# Patient Record
Sex: Male | Born: 1960 | Race: White | Hispanic: No | Marital: Single | Smoking: Current every day smoker
Health system: Southern US, Community
[De-identification: ages and names within clinical notes are randomized; demographics above are authoritative.]

## PROBLEM LIST (undated history)

## (undated) DIAGNOSIS — J449 Chronic obstructive pulmonary disease, unspecified: Secondary | ICD-10-CM

## (undated) DIAGNOSIS — R569 Unspecified convulsions: Secondary | ICD-10-CM

## (undated) DIAGNOSIS — B1920 Unspecified viral hepatitis C without hepatic coma: Secondary | ICD-10-CM

## (undated) DIAGNOSIS — I639 Cerebral infarction, unspecified: Secondary | ICD-10-CM

---

## 2011-08-06 DIAGNOSIS — I639 Cerebral infarction, unspecified: Secondary | ICD-10-CM

## 2011-08-06 HISTORY — DX: Cerebral infarction, unspecified: I63.9

## 2018-12-12 ENCOUNTER — Encounter (HOSPITAL_COMMUNITY): Payer: Self-pay

## 2018-12-12 ENCOUNTER — Other Ambulatory Visit: Payer: Self-pay

## 2018-12-12 ENCOUNTER — Emergency Department (HOSPITAL_COMMUNITY): Payer: HRSA Program

## 2018-12-12 ENCOUNTER — Emergency Department (HOSPITAL_COMMUNITY)
Admission: EM | Admit: 2018-12-12 | Discharge: 2018-12-12 | Disposition: A | Discharge status: Home / Self Care | Payer: HRSA Program | Attending: Emergency Medicine | Admitting: Emergency Medicine

## 2018-12-12 DIAGNOSIS — F17210 Nicotine dependence, cigarettes, uncomplicated: Secondary | ICD-10-CM | POA: Diagnosis not present

## 2018-12-12 DIAGNOSIS — J449 Chronic obstructive pulmonary disease, unspecified: Secondary | ICD-10-CM | POA: Insufficient documentation

## 2018-12-12 DIAGNOSIS — R0789 Other chest pain: Secondary | ICD-10-CM | POA: Insufficient documentation

## 2018-12-12 DIAGNOSIS — F1510 Other stimulant abuse, uncomplicated: Secondary | ICD-10-CM | POA: Diagnosis not present

## 2018-12-12 DIAGNOSIS — Z20828 Contact with and (suspected) exposure to other viral communicable diseases: Secondary | ICD-10-CM | POA: Insufficient documentation

## 2018-12-12 DIAGNOSIS — R42 Dizziness and giddiness: Secondary | ICD-10-CM | POA: Diagnosis not present

## 2018-12-12 DIAGNOSIS — R079 Chest pain, unspecified: Secondary | ICD-10-CM

## 2018-12-12 DIAGNOSIS — R61 Generalized hyperhidrosis: Secondary | ICD-10-CM | POA: Insufficient documentation

## 2018-12-12 DIAGNOSIS — B182 Chronic viral hepatitis C: Secondary | ICD-10-CM | POA: Diagnosis not present

## 2018-12-12 DIAGNOSIS — R0602 Shortness of breath: Secondary | ICD-10-CM | POA: Diagnosis present

## 2018-12-12 HISTORY — DX: Cerebral infarction, unspecified: I63.9

## 2018-12-12 HISTORY — DX: Chronic obstructive pulmonary disease, unspecified: J44.9

## 2018-12-12 HISTORY — DX: Unspecified viral hepatitis C without hepatic coma: B19.20

## 2018-12-12 HISTORY — DX: Unspecified convulsions: R56.9

## 2018-12-12 LAB — CBC WITH DIFFERENTIAL/PLATELET
Abs Immature Granulocytes: 0.01 10*3/uL (ref 0.00–0.07)
Basophils Absolute: 0 10*3/uL (ref 0.0–0.1)
Basophils Relative: 1 %
Eosinophils Absolute: 0 10*3/uL (ref 0.0–0.5)
Eosinophils Relative: 1 %
HCT: 44.7 % (ref 39.0–52.0)
Hemoglobin: 15.1 g/dL (ref 13.0–17.0)
Immature Granulocytes: 0 %
Lymphocytes Relative: 11 %
Lymphs Abs: 0.9 10*3/uL (ref 0.7–4.0)
MCH: 32.7 pg (ref 26.0–34.0)
MCHC: 33.8 g/dL (ref 30.0–36.0)
MCV: 96.8 fL (ref 80.0–100.0)
Monocytes Absolute: 0.6 10*3/uL (ref 0.1–1.0)
Monocytes Relative: 7 %
Neutro Abs: 6.6 10*3/uL (ref 1.7–7.7)
Neutrophils Relative %: 80 %
Platelets: 193 10*3/uL (ref 150–400)
RBC: 4.62 MIL/uL (ref 4.22–5.81)
RDW: 12.8 % (ref 11.5–15.5)
WBC: 8.2 10*3/uL (ref 4.0–10.5)
nRBC: 0 % (ref 0.0–0.2)

## 2018-12-12 LAB — D-DIMER, QUANTITATIVE: D-Dimer, Quant: 0.62 ug/mL-FEU — ABNORMAL HIGH (ref 0.00–0.50)

## 2018-12-12 LAB — RAPID URINE DRUG SCREEN, HOSP PERFORMED
Amphetamines: POSITIVE — AB
Barbiturates: NOT DETECTED
Benzodiazepines: NOT DETECTED
Cocaine: NOT DETECTED
Opiates: NOT DETECTED
Tetrahydrocannabinol: NOT DETECTED

## 2018-12-12 LAB — BASIC METABOLIC PANEL
Anion gap: 14 (ref 5–15)
BUN: 14 mg/dL (ref 6–20)
CO2: 21 mmol/L — ABNORMAL LOW (ref 22–32)
Calcium: 10.3 mg/dL (ref 8.9–10.3)
Chloride: 102 mmol/L (ref 98–111)
Creatinine, Ser: 0.94 mg/dL (ref 0.61–1.24)
GFR calc Af Amer: >60 mL/min (ref >60)
GFR calc non Af Amer: >60 mL/min (ref >60)
Glucose, Bld: 94 mg/dL (ref 70–99)
Potassium: 4.1 mmol/L (ref 3.5–5.1)
Sodium: 137 mmol/L (ref 135–145)

## 2018-12-12 LAB — CBG MONITORING, ED: Glucose-Capillary: 94 mg/dL (ref 70–99)

## 2018-12-12 LAB — TROPONIN I
Troponin I: <0.03 ng/mL (ref <0.03)
Troponin I: <0.03 ng/mL (ref <0.03)

## 2018-12-12 LAB — BRAIN NATRIURETIC PEPTIDE: B Natriuretic Peptide: 28 pg/mL (ref 0.0–100.0)

## 2018-12-12 LAB — SARS CORONAVIRUS 2 BY RT PCR (HOSPITAL ORDER, PERFORMED IN ~~LOC~~ HOSPITAL LAB): SARS Coronavirus 2: NEGATIVE

## 2018-12-12 MED ORDER — FENTANYL CITRATE (PF) 100 MCG/2ML IJ SOLN: 50.0000 ug | INTRAMUSCULAR | Status: DC | PRN

## 2018-12-12 MED ORDER — SODIUM CHLORIDE 0.9 % IV BOLUS
1000.0000 mL | Freq: Once | INTRAVENOUS | Status: AC
  Administered 2018-12-12: 09:00:00 1000 mL via INTRAVENOUS

## 2018-12-12 MED ORDER — IOHEXOL 350 MG/ML SOLN
75.0000 mL | Freq: Once | INTRAVENOUS | Status: AC | PRN
  Administered 2018-12-12: 11:00:00 100 mL via INTRAVENOUS

## 2018-12-12 MED ORDER — ACETAMINOPHEN 500 MG PO TABS
1000.0000 mg | ORAL_TABLET | Freq: Once | ORAL | Status: AC
  Administered 2018-12-12: 09:00:00 1000 mg via ORAL
  Filled 2018-12-12: qty 2

## 2018-12-12 NOTE — ED Triage Notes (Signed)
Pt arrived via EMS, chief complaint chest pain with SOB on exertion. Per EMS pt used IV methamphetamine last night, chest pain developed and persisted throughout night. EMS gave 324 mg aspirin and nitro tabs x 3. Pain 6/10, "dull pain in chest". Pt NAD.

## 2018-12-12 NOTE — ED Notes (Addendum)
Upon entering the pt room, EMT states "pt complaint is from chest pains, pt also tells EMS that he uses meth". Pt states " sorry. yeah, I use it and I am an idiot for it". This NT and EMT states "its okay for being open and thanks for telling us and that could be a critical finding towards your care while in the Emergency Room."

## 2018-12-12 NOTE — ED Notes (Signed)
This RN walked in room and pt began stating "Look at my skin and what crystal meth is doing to it.  I need a shower and new clothes"  Nothing visually noted on patient's skin.   Gave patient items to wash off and a fresh gown.

## 2018-12-12 NOTE — ED Notes (Signed)
Patient verbalizes understanding of discharge instructions. Opportunity for questioning and answers were provided. Armband removed by staff, pt discharged from ED.  

## 2018-12-12 NOTE — ED Notes (Signed)
Pt CBG was 94, notified Cindy(RN)

## 2018-12-12 NOTE — ED Provider Notes (Signed)
[facility] EMERGENCY DEPARTMENT Provider Note   CSN: [id number] Arrival date & time: 12/12/18  4098    History   Chief Complaint Chief Complaint  Patient presents with   Chest Pain    HPI Billy [name] is a 58 y.o. male.     Patient with history of COPD chronic cigarette smoker, intermittent drug abuse, hepatitis C, stroke presents with acute shortness of breath, palpitations and left anterior chest pain since approximately 4 to 5 hours before arrival.  This occurred after he injected methamphetamine into his right AC area.  Patient uses this drug approximate every 2 weeks.  Patient has had mild shortness of breath and cough before this time.  No documented fevers.  No known CO VID.  Patient denies any heart history.     Past Medical History:  Diagnosis Date   COPD (chronic obstructive pulmonary disease) (HCC)    Hepatitis C    Seizures (HCC)    Stroke (HCC) 2013    There are no active problems to display for this patient.         Home Medications    Prior to Admission medications   Not on File    Family History No family history on file.  Social History Social History   Tobacco Use   Smoking status: Current Every Day Smoker    Packs/day: 1.00    Years: 25.00    Pack years: 25.00    Types: Cigarettes   Smokeless tobacco: Never Used  Substance Use Topics   Alcohol use: Yes    Alcohol/week: 6.0 standard drinks    Types: 6 Cans of beer per week    Comment: per day   Drug use: Yes    Frequency: 1.0 times per week    Types: IV, Methamphetamines    Comment: per month     Allergies   Patient has no known allergies.   Review of Systems Review of Systems  Constitutional: Positive for diaphoresis. Negative for chills and fever.  HENT: Negative for congestion.   Eyes: Negative for visual disturbance.  Respiratory: Positive for cough and shortness of breath.   Cardiovascular: Positive for chest pain. Negative for leg  swelling.  Gastrointestinal: Negative for abdominal pain and vomiting.  Genitourinary: Negative for dysuria and flank pain.  Musculoskeletal: Negative for back pain, neck pain and neck stiffness.  Skin: Negative for rash.  Neurological: Positive for light-headedness. Negative for headaches.     Physical Exam Updated Vital Signs BP 128/77    Pulse (!) 102    Temp 98.2 F (36.8 C) (Oral)    Resp 20    Ht 6' (1.829 m)    Wt 95.3 kg    SpO2 98%    BMI 28.48 kg/m   Physical Exam Vitals signs and nursing note reviewed.  Constitutional:      Appearance: He is well-developed.  HENT:     Head: Normocephalic and atraumatic.  Eyes:     General:        Right eye: No discharge.        Left eye: No discharge.     Conjunctiva/sclera: Conjunctivae normal.  Neck:     Musculoskeletal: Normal range of motion and neck supple.     Trachea: No tracheal deviation.  Cardiovascular:     Rate and Rhythm: Regular rhythm. Tachycardia present.     Heart sounds: No systolic murmur.  Pulmonary:     Effort: Tachypnea present.  Abdominal:     General:  There is no distension.     Palpations: Abdomen is soft.     Tenderness: There is no abdominal tenderness. There is no guarding.  Musculoskeletal: Normal range of motion.     Right lower leg: He exhibits no tenderness. No edema.     Left lower leg: He exhibits no tenderness. No edema.  Skin:    General: Skin is warm.     Findings: No rash.  Neurological:     Mental Status: He is alert and oriented to person, place, and time.      ED Treatments / Results  Labs (all labs ordered are listed, but only abnormal results are displayed) Labs Reviewed  BASIC METABOLIC PANEL - Abnormal; Notable for the following components:      Result Value   CO2 21 (*)    All other components within normal limits  D-DIMER, QUANTITATIVE (NOT AT [facility]) - Abnormal; Notable for the following components:   D-Dimer, Quant 0.62 (*)    All other components within normal  limits  RAPID URINE DRUG SCREEN, HOSP PERFORMED - Abnormal; Notable for the following components:   Amphetamines POSITIVE (*)    All other components within normal limits  SARS CORONAVIRUS 2 (HOSPITAL ORDER, PERFORMED IN [facility])  CBC WITH DIFFERENTIAL/PLATELET  TROPONIN I  BRAIN NATRIURETIC PEPTIDE  TROPONIN I  CBG MONITORING, ED    EKG EKG Interpretation  Date/Time:  Saturday Dec 12 2018 08:34:47 EDT Ventricular Rate:  125 PR Interval:    QRS Duration: 83 QT Interval:  306 QTC Calculation: 442 R Axis:   58 Text Interpretation:  Sinus tachycardia Atrial premature complex Probable left atrial enlargement Confirmed by Blane OharaZavitz, Seann Genther [phone]([id number]) on 12/12/2018 8:39:49 AM   Radiology Ct Angio Chest Pe W And/or Wo Contrast  Result Date: 12/12/2018 CLINICAL DATA:  Short of breath.  Concern for pulmonary embolism EXAM: CT ANGIOGRAPHY CHEST WITH CONTRAST TECHNIQUE: Multidetector CT imaging of the chest was performed using the standard protocol during bolus administration of intravenous contrast. Multiplanar CT image reconstructions and MIPs were obtained to evaluate the vascular anatomy. CONTRAST:  100mL OMNIPAQUE IOHEXOL 350 MG/ML SOLN COMPARISON:  12/12/2018 FINDINGS: Cardiovascular: No filling defects within the pulmonary arteries to suggest acute pulmonary embolism. No acute findings of the aorta or great vessels. No pericardial fluid. Mediastinum/Nodes: No axillary supraclavicular adenopathy. No mediastinal adenopathy. No pericardial effusion. Esophagus normal. Coronary artery calcification and aortic atherosclerotic calcification. Lungs/Pleura: Mild RIGHT basilar atelectasis. No pulmonary infarction. No pneumonia. No pneumothorax Upper Abdomen: Limited view of the liver, kidneys, pancreas are unremarkable. Normal adrenal glands. Musculoskeletal: No aggressive osseous lesion. Review of the MIP images confirms the above findings. IMPRESSION: 1. No acute pulmonary embolism. 2. Mild  RIGHT basilar atelectasis. 3. Coronary artery calcification and Aortic Atherosclerosis (ICD10-I70.0). Electronically Signed   By: Genevive BiStewart  Edmunds M.D.   On: 12/12/2018 11:18   Dg Chest Portable 1 View  Result Date: 12/12/2018 CLINICAL DATA:  Shortness of breath, chest pain, and cough. History of COPD. EXAM: PORTABLE CHEST 1 VIEW COMPARISON:  None. FINDINGS: The cardiac silhouette is within normal limits in size for portable AP technique. The thoracic aorta is mildly tortuous. The lungs are hypoinflated with mild interstitial type densities in both lung bases. No airspace consolidation, overt edema, sizable pleural effusion, pneumothorax is identified. There is an old right third rib fracture. IMPRESSION: Hypoinflation with mild bibasilar opacities, likely atelectasis although early infection is not excluded. Electronically Signed   By: Sebastian AcheAllen  Grady M.D.   On:  12/12/2018 09:07    Procedures Procedures (including critical care time)  Medications Ordered in ED Medications  fentaNYL (SUBLIMAZE) injection 50 mcg (has no administration in time range)  sodium chloride 0.9 % bolus 1,000 mL (0 mLs Intravenous Stopped 12/12/18 1020)  acetaminophen (TYLENOL) tablet 1,000 mg (1,000 mg Oral Given 12/12/18 0921)  iohexol (OMNIPAQUE) 350 MG/ML injection 75 mL (100 mLs Intravenous Contrast Given 12/12/18 1051)     Initial Impression / Assessment and Plan / ED Course  I have reviewed the triage vital signs and the nursing notes.  Pertinent labs & imaging results that were available during my care of the patient were reviewed by me and considered in my medical decision making (see chart for details).       Patient presents with chest pain shortness of breath worse since using IV drugs.  This is likely the culprit and with age and vascular history this may have put stress on his heart.  Plan for troponin, d-dimer, pain meds as needed, IV fluids and Tylenol.  D-dimer positive, CT Angie of the chest ordered.  EKG  showed sinus tachycardia reviewed.  CT scan of the chest negative for blood clot.  Patient improved on reassessment heart rate 102 in the room.  Discussed getting help for drug abuse, long discussion with patient.  Patient does want to get help.  Repeat troponin negative.  Patient stable for outpatient follow-up.\  Results and differential diagnosis were discussed with the patient/parent/guardian. Xrays were independently reviewed by myself.  Close follow up outpatient was discussed, comfortable with the plan.   Medications  fentaNYL (SUBLIMAZE) injection 50 mcg (has no administration in time range)  sodium chloride 0.9 % bolus 1,000 mL (0 mLs Intravenous Stopped 12/12/18 1020)  acetaminophen (TYLENOL) tablet 1,000 mg (1,000 mg Oral Given 12/12/18 0921)  iohexol (OMNIPAQUE) 350 MG/ML injection 75 mL (100 mLs Intravenous Contrast Given 12/12/18 1051)    Vitals:   12/12/18 1030 12/12/18 1130 12/12/18 1145 12/12/18 1230  BP: 135/79   128/77  Pulse: (!) 115 (!) 105 (!) 111 (!) 102  Resp: 20 (!) 26 18 20   Temp:      TempSrc:      SpO2: 91% 96% 96% 98%  Weight:      Height:        Final diagnoses:  Amphetamine abuse (HCC)  Acute chest pain     Final Clinical Impressions(s) / ED Diagnoses   Final diagnoses:  Amphetamine abuse (HCC)  Acute chest pain    ED Discharge Orders    None       Blane Ohara, MD 12/12/18 1247

## 2018-12-12 NOTE — Discharge Instructions (Addendum)
Return for worsening chest pain, shortness of breath, fevers or new concerns.  Please seek help for your drug abuse problem.  Follow-up with a local doctor early next week for further evaluation and work-up.

## 2018-12-19 ENCOUNTER — Other Ambulatory Visit: Payer: Self-pay

## 2018-12-19 ENCOUNTER — Observation Stay (HOSPITAL_COMMUNITY)
Admission: AD | Admit: 2018-12-19 | Discharge: 2018-12-20 | Disposition: A | Discharge status: Home / Self Care | Payer: Self-pay | Attending: Psychiatry | Admitting: Psychiatry

## 2018-12-19 ENCOUNTER — Encounter (HOSPITAL_COMMUNITY): Payer: Self-pay

## 2018-12-19 DIAGNOSIS — B1920 Unspecified viral hepatitis C without hepatic coma: Secondary | ICD-10-CM | POA: Insufficient documentation

## 2018-12-19 DIAGNOSIS — F1490 Cocaine use, unspecified, uncomplicated: Secondary | ICD-10-CM | POA: Insufficient documentation

## 2018-12-19 DIAGNOSIS — Z8673 Personal history of transient ischemic attack (TIA), and cerebral infarction without residual deficits: Secondary | ICD-10-CM | POA: Insufficient documentation

## 2018-12-19 DIAGNOSIS — F1290 Cannabis use, unspecified, uncomplicated: Secondary | ICD-10-CM | POA: Insufficient documentation

## 2018-12-19 DIAGNOSIS — F1590 Other stimulant use, unspecified, uncomplicated: Secondary | ICD-10-CM | POA: Insufficient documentation

## 2018-12-19 DIAGNOSIS — F17210 Nicotine dependence, cigarettes, uncomplicated: Secondary | ICD-10-CM | POA: Insufficient documentation

## 2018-12-19 DIAGNOSIS — J449 Chronic obstructive pulmonary disease, unspecified: Secondary | ICD-10-CM | POA: Insufficient documentation

## 2018-12-19 DIAGNOSIS — F419 Anxiety disorder, unspecified: Secondary | ICD-10-CM | POA: Insufficient documentation

## 2018-12-19 DIAGNOSIS — Z7289 Other problems related to lifestyle: Secondary | ICD-10-CM | POA: Insufficient documentation

## 2018-12-19 DIAGNOSIS — F319 Bipolar disorder, unspecified: Secondary | ICD-10-CM | POA: Insufficient documentation

## 2018-12-19 DIAGNOSIS — R45851 Suicidal ideations: Secondary | ICD-10-CM | POA: Insufficient documentation

## 2018-12-19 DIAGNOSIS — F1994 Other psychoactive substance use, unspecified with psychoactive substance-induced mood disorder: Secondary | ICD-10-CM | POA: Diagnosis present

## 2018-12-19 DIAGNOSIS — F19959 Other psychoactive substance use, unspecified with psychoactive substance-induced psychotic disorder, unspecified: Principal | ICD-10-CM | POA: Diagnosis present

## 2018-12-19 MED ORDER — GABAPENTIN 300 MG PO CAPS
300.0000 mg | ORAL_CAPSULE | Freq: Three times a day (TID) | ORAL | Status: DC
  Administered 2018-12-19 – 2018-12-20 (×2): 300 mg via ORAL
  Filled 2018-12-19 (×2): qty 1

## 2018-12-19 MED ORDER — BENZTROPINE MESYLATE 0.5 MG PO TABS
0.5000 mg | ORAL_TABLET | Freq: Two times a day (BID) | ORAL | Status: DC
  Administered 2018-12-19 – 2018-12-20 (×2): 0.5 mg via ORAL
  Filled 2018-12-19 (×2): qty 1

## 2018-12-19 MED ORDER — TRAZODONE HCL 50 MG PO TABS
50.0000 mg | ORAL_TABLET | Freq: Every evening | ORAL | Status: DC | PRN
  Administered 2018-12-19: 21:00:00 50 mg via ORAL
  Filled 2018-12-19: qty 1

## 2018-12-19 MED ORDER — HALOPERIDOL 5 MG PO TABS: 5.0000 mg | ORAL_TABLET | Freq: Two times a day (BID) | ORAL | Status: DC

## 2018-12-19 MED ORDER — RISPERIDONE 1 MG PO TABS
1.0000 mg | ORAL_TABLET | Freq: Two times a day (BID) | ORAL | Status: DC
  Administered 2018-12-19 – 2018-12-20 (×2): 1 mg via ORAL
  Filled 2018-12-19 (×2): qty 1

## 2018-12-19 NOTE — H&P (Signed)
Behavioral Health Medical Screening Exam  Billy [name] is an 58 y.o. male patient presents to [facility] as a walk in with complaints of suicidal ideation and plan to jump off bridge.  Patient reports that he has been doing meth all night and that he wants to kill his room mate for giving it to him.  Patient states that he doesn't have a gun but has money to buy one.  Patient reports that he works for [employer] and moved to [place] 2 months ago but doesn' t like his roommate related to the drug use."I'm going to kill him for feeding me that shit."  Patient states that he is also having hallucination.  Auditory: voices telling him "You're not going to get out alive; we're going to get you."  Visual: "I see people hiding in the bushes." Patient states that he does have a psychiatric history prior psychiatric hospitalization and psychotropics.  States he remembers taking lithium and Risperdal in the past.    Total Time spent with patient: 45 minutes  Psychiatric Specialty Exam: Physical Exam  Constitutional: He is oriented to person, place, and time. He appears well-developed and well-nourished.  Neck: Normal range of motion.  Respiratory: Effort normal.  Musculoskeletal: Normal range of motion.  Neurological: He is alert and oriented to person, place, and time.  Skin: Skin is warm and dry.  Psychiatric: His speech is normal. His mood appears anxious. He is actively hallucinating. Cognition and memory are normal. He expresses impulsivity. He expresses homicidal and suicidal ideation.    Review of Systems  Psychiatric/Behavioral: Positive for depression, hallucinations, substance abuse and suicidal ideas. The patient is nervous/anxious.   All other systems reviewed and are negative.   There were no vitals taken for this visit.There is no height or weight on file to calculate BMI.  General Appearance: Casual  Eye Contact:  Good  Speech:  Clear and Coherent and Normal Rate  Volume:  Normal  Mood:   Anxious and Depressed  Affect:  Congruent and Depressed  Thought Process:  Coherent  Orientation:  Full (Time, Place, and Person)  Thought Content:  Hallucinations: Auditory Visual  Suicidal Thoughts:  Yes.  with intent/plan  Homicidal Thoughts:  Yes.  with intent/plan  Memory:  Immediate;   Good Recent;   Good  Judgement:  Fair  Insight:  Fair  Psychomotor Activity:  Normal  Concentration: Concentration: Fair and Attention Span: Fair  Recall:  Good  Fund of Knowledge:Fair  Language: Good  Akathisia:  No  Handed:  Right  AIMS (if indicated):     Assets:  Communication Skills Desire for Improvement  Sleep:       Musculoskeletal: Strength & Muscle Tone: within normal limits Gait & Station: normal Patient leans: N/A  There were no vitals taken for this visit.  Recommendations: Observation overnight 24 hours  Based on my evaluation the patient does not appear to have an emergency medical condition.  Kialee Kham, NP 12/19/2018, 1:42 PM

## 2018-12-19 NOTE — Progress Notes (Signed)
Billy [name] NOVEL CORONAVIRUS (COVID-19) DAILY CHECK-OFF SYMPTOMS - answer yes or no to each - every day NO YES  Have you had a fever in the past 24 hours?  . Fever (Temp > 37.80C / 100F) X   Have you had any of these symptoms in the past 24 hours? . New Cough .  Sore Throat  .  Shortness of Breath .  Difficulty Breathing .  Unexplained Body Aches   X   Have you had any one of these symptoms in the past 24 hours not related to allergies?   . Runny Nose .  Nasal Congestion .  Sneezing   X   If you have had runny nose, nasal congestion, sneezing in the past 24 hours, has it worsened?  X   EXPOSURES - check yes or no X   Have you traveled outside the state in the past 14 days?  X   Have you been in contact with someone with a confirmed diagnosis of COVID-19 or PUI in the past 14 days without wearing appropriate PPE?  X   Have you been living in the same home as a person with confirmed diagnosis of COVID-19 or a PUI (household contact)?    X   Have you been diagnosed with COVID-19?    X              What to do next: Answered NO to all: Answered YES to anything:   Proceed with unit schedule Follow the [facility] Inpatient Flowsheet.   

## 2018-12-19 NOTE — BH Assessment (Signed)
[facility] Assessment Progress Note  Case was staffed with Rankin NP who recommended patient be observed and monitored for safety.

## 2018-12-19 NOTE — H&P (Signed)
BH Observation Unit Provider Admission PAA/H&P  Patient Identification: Billy [name] MRN:  [id number] Date of Evaluation:  12/19/2018 Chief Complaint:  SI Principal Diagnosis: Substance-induced psychotic disorder (HCC) Diagnosis:  Principal Problem:   Substance-induced psychotic disorder (HCC) Active Problems:   Substance induced mood disorder (HCC)   Psychoactive substance-induced psychosis (HCC)  History of Present Illness: Per MSE preformed by this provider:Dakwan [name] is an 58 y.o. male patient [facility] as a walk in with complaints of suicidal ideation and plan to jump off bridge.  Patient reports that he has been doing meth all night and that he wants to kill his room mate for giving it to him.  Patient states that he doesn't have a gun but has money to buy one.  Patient reports that he works for [employer] and moved to [place] 2 months ago but doesn' t like his roommate related to the drug use."I'm going to kill him for feeding me that shit."  Patient states that he is also having hallucination.  Auditory: voices telling him "You're not going to get out alive; we're going to get you."  Visual: "I see people hiding in the bushes." Patient states that he does have a psychiatric history prior psychiatric hospitalization and psychotropics.  States he remembers taking lithium and Risperdal in the past.     Associated Signs/Symptoms: Depression Symptoms:  hopelessness, suicidal thoughts with specific plan, anxiety, (Hypo) Manic Symptoms:  Hallucinations, Impulsivity, Irritable Mood, Anxiety Symptoms:  Excessive Worry, Psychotic Symptoms:  Hallucinations: Auditory Visual PTSD Symptoms: Denies Total Time spent with patient: 45 minutes  Past Psychiatric History: History of bipolar disorder, substance use disorder, meth use disorder; prior psychiatric hospitalization and psychotropic.  Not currently taking any medications or have outpatient psychiatric services  Is the  patient at risk to self? Yes.    Has the patient been a risk to self in the past 6 months? No.  Has the patient been a risk to self within the distant past? Yes.    Is the patient a risk to others? Yes.    Has the patient been a risk to others in the past 6 months? No.  Has the patient been a risk to others within the distant past? No.   Prior Inpatient Therapy: Prior Inpatient Therapy: Yes Prior Therapy Dates: 2009 Prior Therapy Facilty/Provider(s): Pt cannot recall states when he was in prison Reason for Treatment: MH issues Prior Outpatient Therapy: Prior Outpatient Therapy: No Does patient have an ACCT team?: No Does patient have Intensive In-House Services?  : No Does patient have [facility] services? : No Does patient have P4CC services?: No  Alcohol Screening:   Substance Abuse History in the last 12 months:  Yes.   Consequences of Substance Abuse: Family Consequences:  Family discord Psychosis Previous Psychotropic Medications: Yes  Psychological Evaluations: Yes  Past Medical History:  Past Medical History:  Diagnosis Date  . COPD (chronic obstructive pulmonary disease) (HCC)   . Hepatitis C   . Seizures (HCC)   . Stroke [facility](HCC) 2013   Family History: No family history on file.  Unaware Family Psychiatric History: Unaware Tobacco Screening:   Social History:  Social History   Substance and Sexual Activity  Alcohol Use Yes  . Alcohol/week: 6.0 standard drinks  . Types: 6 Cans of beer per week   Comment: per day     Social History   Substance and Sexual Activity  Drug Use Yes  . Frequency: 1.0 times per week  . Types:  IV, Methamphetamines   Comment: per month    Additional Social History: Marital status: Single    Pain Medications: See MAR Prescriptions: See MAR Over the Counter: See MAR History of alcohol / drug use?: Yes Longest period of sobriety (when/how long): Unknown Negative Consequences of Use: (Denies) Withdrawal Symptoms: (Denies) Name of  Substance 1: Alcohol 1 - Age of First Use: UTA 1 - Amount (size/oz): Varies 1 - Frequency: Varies 1 - Duration: Ongoing 1 - Last Use / Amount: 12/18/18 Unknown amount Name of Substance 2: Methamphetamine  2 - Age of First Use: UTA 2 - Amount (size/oz): Varies 2 - Frequency: Varies 2 - Duration: Ongoing 2 - Last Use / Amount: 12/18/18 Unknown amount  Allergies:  No Known Allergies Lab Results: No results found for this or any previous visit (from the past 48 hour(s)).  Blood Alcohol level:  No results found for: [facility]  Metabolic Disorder Labs:  No results found for: HGBA1C, MPG No results found for: PROLACTIN No results found for: CHOL, TRIG, HDL, CHOLHDL, VLDL, LDLCALC  Current Medications: No current outpatient medications on file.   No current facility-administered medications for this encounter.    PTA Medications: (Not in a hospital admission)   Psychiatric Specialty Exam: Physical Exam  Constitutional: He is oriented to person, place, and time. He appears well-developed and well-nourished.  Neck: Normal range of motion.  Respiratory: Effort normal.  Musculoskeletal: Normal range of motion.  Neurological: He is alert and oriented to person, place, and time.  Skin: Skin is warm and dry.  Psychiatric: His speech is normal. His mood appears anxious. He is actively hallucinating. Cognition and memory are normal. He expresses impulsivity. He expresses homicidal and suicidal ideation.    Review of Systems  Psychiatric/Behavioral: Positive for depression, hallucinations, substance abuse and suicidal ideas. The patient is nervous/anxious.   All other systems reviewed and are negative.   There were no vitals taken for this visit.There is no height or weight on file to calculate BMI.  General Appearance: Casual  Eye Contact:  Good  Speech:  Clear and Coherent and Normal Rate  Volume:  Normal  Mood:  Anxious and Depressed  Affect:  Congruent and Depressed  Thought  Process:  Coherent  Orientation:  Full (Time, Place, and Person)  Thought Content:  Hallucinations: Auditory Visual  Suicidal Thoughts:  Yes.  with intent/plan  Homicidal Thoughts:  Yes.  with intent/plan  Memory:  Immediate;   Good Recent;   Good  Judgement:  Fair  Insight:  Fair  Psychomotor Activity:  Normal  Concentration: Concentration: Fair and Attention Span: Fair  Recall:  Good  Fund of Knowledge:Fair  Language: Good  Akathisia:  No  Handed:  Right  AIMS (if indicated):     Assets:  Communication Skills Desire for Improvement  Sleep:       Musculoskeletal: Strength & Muscle Tone: within normal limits Gait & Station: normal Patient leans: N/A   Treatment Plan Summary: Medication management and Plan 24 hour observation  Observation Level/Precautions:  15 minute checks Laboratory:  CBC Chemistry Profile HCG UDS UA Psychotherapy:  As appropriated Medications:  Will start Risperdal for psychosis, and Cogentin for EPS Consultations:  As appropriate Discharge Concerns:  Safety, stabilization, and access to medication Estimated LOS:  24 hours Other:      Shuvon Rankin, NP 5/16/20202:04 PM

## 2018-12-19 NOTE — Progress Notes (Signed)
Billy [name] appears frightened/anxious/irritable on approach. He denies SI/AVH/Pain at this time. PRN trazodone offered HS; Pt accepted. Pt was given both fluids and snacks. Support offered. Will monitor in OBS.

## 2018-12-19 NOTE — Plan of Care (Signed)
[facility] Observation Crisis Plan  Reason for Crisis Plan:  Chronic Mental Illness/Medical Illness, Crisis Stabilization, Medication Management and Substance Abuse   Plan of Care:  Referral for Inpatient Hospitalization and Referral for Substance Abuse  Family Support:      Current Living Environment:  Living Arrangements: Non-relatives/Friends  Insurance:   Hospital Account    Name Acct ID Class Status Primary Coverage   Billy [name], Billy [name] [id number] BEHAVIORAL HEALTH OBSERVATION Open [facility] FOR MH/DD/SAS - [place] [place] [employer]        Guarantor Account (for Hospital Account [id number])    Name Relation to Pt Service Area Active? Acct Type   [name], Billy Self [facility] Yes Behavioral Health   Address Phone       [address] [place], [place] [address] [phone](H)          Coverage Information (for Hospital Account [id number])    F/O Payor/Plan Precert #   [facility] FOR MH/DD/SAS/[place] [place] [employer]    Subscriber Subscriber #   Naveed, Kolber [id number]   Address Phone   [address] [place], [place] [address] [phone]      Legal Guardian:  Legal Guardian: (NA)  Primary Care Provider:  Patient, No Pcp Per  Current Outpatient Providers:  None  Psychiatrist:  Name of Psychiatrist: None  Counselor/Therapist:  Name of Therapist: None  Compliant with Medications:  No  Additional Information: Patient with psychiatric hx, off medications, abusing illicit drugs, and paranoid.   Kirstie Mirza 5/16/20206:53 PM
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
## 2018-12-20 DIAGNOSIS — F19959 Other psychoactive substance use, unspecified with psychoactive substance-induced psychotic disorder, unspecified: Secondary | ICD-10-CM

## 2018-12-20 LAB — CBC
HCT: 48.9 % (ref 39.0–52.0)
Hemoglobin: 16.7 g/dL (ref 13.0–17.0)
MCH: 33.9 pg (ref 26.0–34.0)
MCHC: 34.2 g/dL (ref 30.0–36.0)
MCV: 99.2 fL (ref 80.0–100.0)
Platelets: 204 10*3/uL (ref 150–400)
RBC: 4.93 MIL/uL (ref 4.22–5.81)
RDW: 12.9 % (ref 11.5–15.5)
WBC: 5.8 10*3/uL (ref 4.0–10.5)
nRBC: 0 % (ref 0.0–0.2)

## 2018-12-20 LAB — BASIC METABOLIC PANEL
Anion gap: 8 (ref 5–15)
BUN: 13 mg/dL (ref 6–20)
CO2: 28 mmol/L (ref 22–32)
Calcium: 9.6 mg/dL (ref 8.9–10.3)
Chloride: 103 mmol/L (ref 98–111)
Creatinine, Ser: 0.81 mg/dL (ref 0.61–1.24)
GFR calc Af Amer: >60 mL/min (ref >60)
GFR calc non Af Amer: >60 mL/min (ref >60)
Glucose, Bld: 97 mg/dL (ref 70–99)
Potassium: 5.1 mmol/L (ref 3.5–5.1)
Sodium: 139 mmol/L (ref 135–145)

## 2018-12-20 LAB — LIPID PANEL
Cholesterol: 205 mg/dL — ABNORMAL HIGH (ref 0–200)
HDL: 44 mg/dL (ref >40)
LDL Cholesterol: 148 mg/dL — ABNORMAL HIGH (ref 0–99)
Total CHOL/HDL Ratio: 4.7 RATIO
Triglycerides: 67 mg/dL (ref <150)
VLDL: 13 mg/dL (ref 0–40)

## 2018-12-20 LAB — URINALYSIS, COMPLETE (UACMP) WITH MICROSCOPIC
Bacteria, UA: NONE SEEN
Bilirubin Urine: NEGATIVE
Glucose, UA: NEGATIVE mg/dL
Hgb urine dipstick: NEGATIVE
Ketones, ur: NEGATIVE mg/dL
Leukocytes,Ua: NEGATIVE
Nitrite: NEGATIVE
Protein, ur: NEGATIVE mg/dL
Specific Gravity, Urine: 1.015 (ref 1.005–1.030)
pH: 5 (ref 5.0–8.0)

## 2018-12-20 LAB — RAPID URINE DRUG SCREEN, HOSP PERFORMED
Amphetamines: POSITIVE — AB
Barbiturates: NOT DETECTED
Benzodiazepines: NOT DETECTED
Cocaine: POSITIVE — AB
Opiates: NOT DETECTED
Tetrahydrocannabinol: POSITIVE — AB

## 2018-12-20 LAB — ETHANOL: Alcohol, Ethyl (B): <10 mg/dL (ref <10)

## 2018-12-20 LAB — TSH: TSH: 2.331 u[IU]/mL (ref 0.350–4.500)

## 2018-12-20 LAB — HEMOGLOBIN A1C
Hgb A1c MFr Bld: 5 % (ref 4.8–5.6)
Mean Plasma Glucose: 96.8 mg/dL

## 2018-12-20 MED ORDER — TRAZODONE HCL 50 MG PO TABS: 50.0000 mg | ORAL_TABLET | Freq: Every evening | ORAL | 0 refills | Status: DC | PRN

## 2018-12-20 MED ORDER — GABAPENTIN 300 MG PO CAPS: 300.0000 mg | ORAL_CAPSULE | Freq: Three times a day (TID) | ORAL | 0 refills | Status: DC

## 2018-12-20 MED ORDER — BENZTROPINE MESYLATE 0.5 MG PO TABS: 0.5000 mg | ORAL_TABLET | Freq: Every day | ORAL | 0 refills | Status: DC

## 2018-12-20 MED ORDER — RISPERIDONE 1 MG PO TABS: 1.0000 mg | ORAL_TABLET | Freq: Two times a day (BID) | ORAL | 0 refills | Status: DC

## 2018-12-20 NOTE — Progress Notes (Signed)
   12/20/18 0740  COVID-19 Daily Checkoff  Have you had a fever (temp > 37.80C/100F)  in the past 24 hours?  No  If you have had runny nose, nasal congestion, sneezing in the past 24 hours, has it worsened? No  COVID-19 EXPOSURE  Have you traveled outside the state in the past 14 days? No  Have you been in contact with someone with a confirmed diagnosis of COVID-19 or PUI in the past 14 days without wearing appropriate PPE? No  Have you been living in the same home as a person with confirmed diagnosis of COVID-19 or a PUI (household contact)? No  Have you been diagnosed with COVID-19? No
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
## 2018-12-20 NOTE — Progress Notes (Signed)
D: Pt A & O X 3. Denies SI, HI, AVH and pain at this time. D/C home as ordered. Patient took bus to homeless shelter. Shelter address and phone numbers given to patient.  A: D/C instructions reviewed with pt including prescriptions and follow up appointment, compliance encouraged. All belongings from locker given to pt at time of departure. Scheduled and PRN medications given with verbal education and effects monitored. Safety checks maintained without incident till time of d/c.  R: Pt receptive to care. Compliant with medications when offered. Denies adverse drug reactions when assessed. Verbalized understanding related to d/c instructions. Signed belonging sheet in agreement with items received from locker. Ambulatory with a steady gait. Appears to be in no physical distress at time of departure.

## 2019-01-05 ENCOUNTER — Emergency Department (HOSPITAL_COMMUNITY)
Admission: EM | Admit: 2019-01-05 | Discharge: 2019-01-06 | Disposition: A | Discharge status: Other Acute Inpatient Hospital | Payer: Self-pay | Attending: Emergency Medicine | Admitting: Emergency Medicine

## 2019-01-05 ENCOUNTER — Other Ambulatory Visit: Payer: Self-pay

## 2019-01-05 ENCOUNTER — Encounter (HOSPITAL_COMMUNITY): Payer: Self-pay

## 2019-01-05 DIAGNOSIS — F329 Major depressive disorder, single episode, unspecified: Secondary | ICD-10-CM

## 2019-01-05 DIAGNOSIS — F332 Major depressive disorder, recurrent severe without psychotic features: Secondary | ICD-10-CM | POA: Insufficient documentation

## 2019-01-05 DIAGNOSIS — Z8673 Personal history of transient ischemic attack (TIA), and cerebral infarction without residual deficits: Secondary | ICD-10-CM | POA: Insufficient documentation

## 2019-01-05 DIAGNOSIS — F17210 Nicotine dependence, cigarettes, uncomplicated: Secondary | ICD-10-CM | POA: Insufficient documentation

## 2019-01-05 DIAGNOSIS — Z20828 Contact with and (suspected) exposure to other viral communicable diseases: Secondary | ICD-10-CM | POA: Insufficient documentation

## 2019-01-05 DIAGNOSIS — Z79899 Other long term (current) drug therapy: Secondary | ICD-10-CM | POA: Insufficient documentation

## 2019-01-05 DIAGNOSIS — J449 Chronic obstructive pulmonary disease, unspecified: Secondary | ICD-10-CM | POA: Insufficient documentation

## 2019-01-05 LAB — URINALYSIS, ROUTINE W REFLEX MICROSCOPIC
Bacteria, UA: NONE SEEN
Bilirubin Urine: NEGATIVE
Glucose, UA: NEGATIVE mg/dL
Hgb urine dipstick: NEGATIVE
Ketones, ur: NEGATIVE mg/dL
Leukocytes,Ua: NEGATIVE
Nitrite: NEGATIVE
Protein, ur: 30 mg/dL — AB
Specific Gravity, Urine: 1.026 (ref 1.005–1.030)
pH: 5 (ref 5.0–8.0)

## 2019-01-05 LAB — ACETAMINOPHEN LEVEL: Acetaminophen (Tylenol), Serum: <10 ug/mL — ABNORMAL LOW (ref 10–30)

## 2019-01-05 LAB — ETHANOL: Alcohol, Ethyl (B): <10 mg/dL (ref <10)

## 2019-01-05 LAB — RAPID URINE DRUG SCREEN, HOSP PERFORMED
Amphetamines: POSITIVE — AB
Barbiturates: NOT DETECTED
Benzodiazepines: NOT DETECTED
Cocaine: POSITIVE — AB
Opiates: NOT DETECTED
Tetrahydrocannabinol: POSITIVE — AB

## 2019-01-05 LAB — CBC WITH DIFFERENTIAL/PLATELET
Abs Immature Granulocytes: 0.02 10*3/uL (ref 0.00–0.07)
Basophils Absolute: 0.1 10*3/uL (ref 0.0–0.1)
Basophils Relative: 1 %
Eosinophils Absolute: 0.2 10*3/uL (ref 0.0–0.5)
Eosinophils Relative: 2 %
HCT: 45.5 % (ref 39.0–52.0)
Hemoglobin: 15.4 g/dL (ref 13.0–17.0)
Immature Granulocytes: 0 %
Lymphocytes Relative: 22 %
Lymphs Abs: 1.8 10*3/uL (ref 0.7–4.0)
MCH: 32.8 pg (ref 26.0–34.0)
MCHC: 33.8 g/dL (ref 30.0–36.0)
MCV: 97 fL (ref 80.0–100.0)
Monocytes Absolute: 0.8 10*3/uL (ref 0.1–1.0)
Monocytes Relative: 10 %
Neutro Abs: 5 10*3/uL (ref 1.7–7.7)
Neutrophils Relative %: 65 %
Platelets: 215 10*3/uL (ref 150–400)
RBC: 4.69 MIL/uL (ref 4.22–5.81)
RDW: 13.1 % (ref 11.5–15.5)
WBC: 7.8 10*3/uL (ref 4.0–10.5)
nRBC: 0 % (ref 0.0–0.2)

## 2019-01-05 LAB — BASIC METABOLIC PANEL
Anion gap: 11 (ref 5–15)
BUN: 13 mg/dL (ref 6–20)
CO2: 23 mmol/L (ref 22–32)
Calcium: 9.6 mg/dL (ref 8.9–10.3)
Chloride: 101 mmol/L (ref 98–111)
Creatinine, Ser: 0.96 mg/dL (ref 0.61–1.24)
GFR calc Af Amer: >60 mL/min (ref >60)
GFR calc non Af Amer: >60 mL/min (ref >60)
Glucose, Bld: 91 mg/dL (ref 70–99)
Potassium: 4.6 mmol/L (ref 3.5–5.1)
Sodium: 135 mmol/L (ref 135–145)

## 2019-01-05 LAB — SARS CORONAVIRUS 2 BY RT PCR (HOSPITAL ORDER, PERFORMED IN ~~LOC~~ HOSPITAL LAB): SARS Coronavirus 2: NEGATIVE

## 2019-01-05 LAB — SALICYLATE LEVEL: Salicylate Lvl: <7 mg/dL (ref 2.8–30.0)

## 2019-01-05 MED ORDER — LORAZEPAM 2 MG/ML IJ SOLN: 0.0000 mg | Freq: Two times a day (BID) | INTRAMUSCULAR | Status: DC

## 2019-01-05 MED ORDER — LORAZEPAM 1 MG PO TABS
0.0000 mg | ORAL_TABLET | Freq: Four times a day (QID) | ORAL | Status: DC
  Administered 2019-01-06: 1 mg via ORAL
  Filled 2019-01-05 (×2): qty 1

## 2019-01-05 MED ORDER — THIAMINE HCL 100 MG/ML IJ SOLN: 100.0000 mg | Freq: Every day | INTRAMUSCULAR | Status: DC

## 2019-01-05 MED ORDER — LORAZEPAM 1 MG PO TABS: 0.0000 mg | ORAL_TABLET | Freq: Two times a day (BID) | ORAL | Status: DC

## 2019-01-05 MED ORDER — LORAZEPAM 2 MG/ML IJ SOLN: 0.0000 mg | Freq: Four times a day (QID) | INTRAMUSCULAR | Status: DC

## 2019-01-05 MED ORDER — VITAMIN B-1 100 MG PO TABS
100.0000 mg | ORAL_TABLET | Freq: Every day | ORAL | Status: DC
  Administered 2019-01-05 – 2019-01-06 (×2): 100 mg via ORAL
  Filled 2019-01-05 (×3): qty 1

## 2019-01-05 NOTE — ED Notes (Signed)
TTS at bedside. 

## 2019-01-05 NOTE — BH Assessment (Signed)
Tele Assessment Note   Patient Name: Billy [name] MRN: [id number] Referring Physician: Dr. Noralyn Pick. Rodena Medin, MD Location of Patient: [facility] Location of Provider: Behavioral Health TTS Department  Billy [name] is a 58 y.o. male brought in by [facility] after being "found at the top of a tall staircase with intentions to commit suicide."  Pt states "I'm tired and today I was going to jump.  I want help but no one will help me."  Pt admits to having suicidal thoughts. "I want to kill my roommate and his girlfriend.  I walk around with a knife, I just want to stab him in the chest.  I hate him for getting me strung out on these drugs."  Pt admits to substance use.  Pt admits to unknown amount of Alcohol, Cannabis, Meth, and Crack Cocaine; last used 01/04/19.  Pt admits to prior inpatient treatment in [place], [place] at [facility].  Pt reports that he has done long-term substance abuse treatment program in [place] and would like to find another program.  Pt resides with a roommate and the roommate's girlfriend.  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    Patient was wearing scrubs and appeared appropriately groomed.  Pt was alert throughout the assessment.  Patient made fair eye contact and had normal psychomotor activity.  Patient spoke in a loud voice without pressured speech.  Pt expressed feeling frustrated and hopeless.  Pt's affect appeared dysphoric and congruent with stated mood. Pt's thought process was coherent and logical.  Pt presented with partial insight and judgement.  Pt did not appear to be responding to internal stimuli.  Pt was not able to contract for safety.   Disposition: [facility] discussed case with BH Provider, Assunta Found, NP who recommends inpatient treatment.  TTS will look for inpatient treatment.  Diagnosis: F33.2 Major Depressive Disorder, Severe  Past Medical History:  Past Medical History:  Diagnosis Date  . COPD (chronic obstructive pulmonary disease)  (HCC)   . Hepatitis C   . Seizures (HCC)   . Stroke [facility] 2013    History reviewed. No pertinent surgical history.  Family History: History reviewed. No pertinent family history.  Social History:  reports that he has been smoking cigarettes. He has a 25.00 pack-year smoking history. He has never used smokeless tobacco. He reports current alcohol use of about 8.0 standard drinks of alcohol per week. He reports current drug use. Frequency: 1.00 time per week. Drugs: IV, Methamphetamines, Cocaine, and Marijuana.  Additional Social History:  Alcohol / Drug Use Pain Medications: See MARs Prescriptions: See MARs Over the Counter: See MARs History of alcohol / drug use?: Yes Longest period of sobriety (when/how long): unknown Negative Consequences of Use: Personal relationships, Work / Programmer, multimedia, Surveyor, quantity Substance #1 Name of Substance 1: Crack Cocaine 1 - Age of First Use: unknown 1 - Amount (size/oz): unknown 1 - Frequency: unknown  1 - Duration: ongoing 1 - Last Use / Amount: PTA Substance #2 Name of Substance 2: Meth 2 - Age of First Use: unknown 2 - Amount (size/oz): unknwon 2 - Frequency: unknown 2 - Duration: ongoing 2 - Last Use / Amount: PTA Substance #3 Name of Substance 3: Alcohol 3 - Age of First Use: unknown 3 - Amount (size/oz): unknown 3 - Frequency: unknown 3 - Duration: unknown 3 - Last Use / Amount: PTA  CIWA: CIWA-Ar BP: 126/85 Pulse Rate: 90 Nausea and Vomiting: no nausea and no vomiting Tactile Disturbances: very mild itching, pins and needles,  burning or numbness Tremor: no tremor Auditory Disturbances: mild harshness or ability to frighten Paroxysmal Sweats: no sweat visible Visual Disturbances: not present Anxiety: mildly anxious Headache, Fullness in Head: very mild Agitation: somewhat more than normal activity Orientation and Clouding of Sensorium: oriented and can do serial additions CIWA-Ar Total: 6 COWS:    Allergies: Not on File  Home  Medications: (Not in a hospital admission)   OB/GYN Status:  No LMP for male patient.  General Assessment Data [facility] Assessment: In system Is this a Tele or Face-to-Face Assessment?: Tele Assessment Is this an Initial Assessment or a Re-assessment for this encounter?: Initial Assessment Patient Accompanied by:: N/A Language Other than English: No Living Arrangements: Other (Comment)(Lives with a roommate) What gender do you identify as?: Male Marital status: Divorced(3 times) Living Arrangements: Non-relatives/Friends Can pt return to current living arrangement?: Yes Admission Status: Voluntary Is patient capable of signing voluntary admission?: Yes Referral Source: Self/Family/Friend     Crisis Care Plan Living Arrangements: Non-relatives/Friends Name of Psychiatrist: NA Name of Therapist: NA  Education Status Is patient currently in school?: No Is the patient employed, unemployed or receiving disability?: Employed(construction work Soil scientist(daywork))  Risk to self with the past 6 months Suicidal Ideation: Yes-Currently Present Has patient been a risk to self within the past 6 months prior to admission? : Yes Suicidal Intent: Yes-Currently Present Has patient had any suicidal intent within the past 6 months prior to admission? : Yes Is patient at risk for suicide?: Yes Suicidal Plan?: Yes-Currently Present Has patient had any suicidal plan within the past 6 months prior to admission? : Yes Specify Current Suicidal Plan: Jump off a stature Access to Means: Yes Specify Access to Suicidal Means: run in traffic, jump off a building What has been your use of drugs/alcohol within the last 12 months?: alcohol, meth, crack,cannabis Previous Attempts/Gestures: Yes Triggers for Past Attempts: Other (Comment)(roommate) Family Suicide History: No Recent stressful life event(s): Job Loss, Other (Comment) Persecutory voices/beliefs?: No Depression:  Yes Depression Symptoms: Insomnia, Tearfulness, Loss of interest in usual pleasures, Feeling worthless/self pity Substance abuse history and/or treatment for substance abuse?: Yes Suicide prevention information given to non-admitted patients: Not applicable  Risk to Others within the past 6 months Homicidal Ideation: Yes-Currently Present Does patient have any lifetime risk of violence toward others beyond the six months prior to admission? : Yes (comment) Thoughts of Harm to Others: Yes-Currently Present Comment - Thoughts of Harm to Others: Harm roommate and his girlfriend Current Homicidal Intent: Yes-Currently Present Current Homicidal Plan: Yes-Currently Present Describe Current Homicidal Plan: stab him in the heart Access to Homicidal Means: Yes Describe Access to Homicidal Means: knives Identified Victim: Roommate History of harm to others?: Yes Assessment of Violence: None Noted Violent Behavior Description: 5 yrs in prison for assault Does patient have access to weapons?: No Criminal Charges Pending?: No Does patient have a court date: No Is patient on probation?: No  Psychosis Hallucinations: None noted Delusions: None noted  Mental Status Report Appearance/Hygiene: In scrubs Eye Contact: Fair Motor Activity: Restlessness Speech: Pressured Level of Consciousness: Alert, Crying Mood: Depressed, Helpless, Sad, Worthless, low self-esteem Affect: Appropriate to circumstance, Depressed, Sad Anxiety Level: None Thought Processes: Coherent, Relevant Judgement: Partial Orientation: Person, Place, Appropriate for developmental age Obsessive Compulsive Thoughts/Behaviors: None  Cognitive Functioning Concentration: Normal Memory: Recent Intact, Remote Intact Is patient IDD: No Insight: Fair Impulse Control: Poor Appetite: Fair Have you had any weight changes? : No Change Sleep: Decreased Total  Hours of Sleep: 4 Vegetative Symptoms: Decreased grooming  ADLScreening  [facility] Assessment Services) Patient's cognitive ability adequate to safely complete daily activities?: Yes Patient able to express need for assistance with ADLs?: Yes Independently performs ADLs?: Yes (appropriate for developmental age)  Prior Inpatient Therapy Prior Inpatient Therapy: Yes Prior Therapy Dates: unknown Prior Therapy Facilty/Provider(s): [facility] Reason for Treatment: MH/SA  Prior Outpatient Therapy Prior Outpatient Therapy: No Does patient have an ACCT team?: No Does patient have Intensive In-House Services?  : No Does patient have [facility] services? : No Does patient have P4CC services?: No  ADL Screening (condition at time of admission) Patient's cognitive ability adequate to safely complete daily activities?: Yes Is the patient deaf or have difficulty hearing?: No Does the patient have difficulty seeing, even when wearing glasses/contacts?: No Does the patient have difficulty concentrating, remembering, or making decisions?: No Patient able to express need for assistance with ADLs?: Yes Does the patient have difficulty dressing or bathing?: No Independently performs ADLs?: Yes (appropriate for developmental age) Does the patient have difficulty walking or climbing stairs?: No Weakness of Legs: None Weakness of Arms/Hands: None  Home Assistive Devices/Equipment Home Assistive Devices/Equipment: None    Abuse/Neglect Assessment (Assessment to be complete while patient is alone) Abuse/Neglect Assessment Can Be Completed: Yes Physical Abuse: Denies Verbal Abuse: Denies Sexual Abuse: Denies Exploitation of patient/patient's resources: Denies Self-Neglect: Denies Values / Beliefs Cultural Requests During Hospitalization: None   Advance Directives (For Healthcare) Does Patient Have a Medical Advance Directive?: No Type of Advance Directive: Healthcare Power of Attorney(Nicole Coss) Copy of Healthcare Power of Attorney in Chart?: No - copy  requested Would patient like information on creating a medical advance directive?: No - Patient declined          Disposition: [facility] discussed case with BH Provider, Assunta Found, NP who recommends inpatient treatment.  TTS will look for inpatient treatment.  Disposition Initial Assessment Completed for this Encounter: Yes Disposition of Patient: Admit(Per Shuvon Rankin, NP) Type of inpatient treatment program: Adult Patient refused recommended treatment: No Mode of transportation if patient is discharged/movement?: N/A Patient referred to: Other (Comment)(TTS will find placement)  This service was provided via telemedicine using a 2-way, interactive audio and video technology.  Names of all persons participating in this telemedicine service and their role in this encounter. Name: Kyree Kosiba Role: Patient  Name: Daiveon Markman L. Levaughn Puccinelli, MS, [facility], NCC Role: Triage Therapist  Name: Assunta Found, NP Role: [facility] Provider  Name:  Role:     Tyron Russell, MS, [facility], NCC 01/05/2019 6:24 PM

## 2019-01-05 NOTE — ED Notes (Signed)
ED Provider at bedside. 

## 2019-01-05 NOTE — ED Notes (Signed)
Pt changed to maroon scrubs. Belongings removed.

## 2019-01-05 NOTE — ED Notes (Signed)
Pt eating supper tray and tolerating well.

## 2019-01-05 NOTE — ED Provider Notes (Signed)
[facility] EMERGENCY DEPARTMENT Provider Note   CSN: [id number] Arrival date & time: 01/05/19  1230    History   Chief Complaint Chief Complaint  Patient presents with  . Suicidal    HPI Billy [name] is a 58 y.o. male.     58 year old male brought in by police department for depression.  Patient states that [place] the [place] and climbed onto the other side of the railing with plan to jump, was seen by Hydrographic surveyor who was able to talk him into climbing down safely.  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  Patient mentions recent job loss as well as hearing voices in his head described as himself yelling or laughing at himself or as his mother talking to him which feels very degrading.  Patient states last week he laid in bed for the entire week, difficulty sleeping.  Patient is not on any medications currently.  Patient admits to polysubstance abuse and alcohol use, last had 5-6 beers 2 days ago, states he has not been drinking heavily recently otherwise.  Patient denies feeling sweaty or feeling as if he has bugs crawling on him, denies visual hallucinations, nausea, vomiting or agitation.  No other complaints or concerns.     Past Medical History:  Diagnosis Date  . COPD (chronic obstructive pulmonary disease) (HCC)   . Hepatitis C   . Seizures (HCC)   . Stroke [facility]) 2013    Patient Active Problem List   Diagnosis Date Noted  . 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 Psychoactive substance-induced psychosis (HCC) 12/19/2018    History reviewed. No pertinent surgical history.      Home Medications    Prior to Admission medications   Medication Sig Start Date End Date Taking? Authorizing Provider  benztropine (COGENTIN) 0.5 MG tablet Take 1  tablet (0.5 mg total) by mouth daily. 12/20/18   Charm Rings, NP  gabapentin (NEURONTIN) 300 MG capsule Take 1 capsule (300 mg total) by mouth 3 (three) times daily. 12/20/18   Charm Rings, NP  risperiDONE (RISPERDAL) 1 MG tablet Take 1 tablet (1 mg total) by mouth 2 (two) times daily. 12/20/18   Charm Rings, NP  traZODone (DESYREL) 50 MG tablet Take 1 tablet (50 mg total) by mouth at bedtime as needed for sleep. 12/20/18   Charm Rings, NP    Family History History reviewed. No pertinent family history.  Social History Social History   Tobacco Use  . Smoking status: Current Every Day Smoker    Packs/day: 1.00    Years: 25.00    Pack years: 25.00    Types: Cigarettes  . Smokeless tobacco: Never Used  Substance Use Topics  . Alcohol use: Yes    Alcohol/week: 8.0 standard drinks    Types: 8 Cans of beer per week    Comment: per week  . Drug use: Yes    Frequency: 1.0 times per week    Types: IV, Methamphetamines, Cocaine, Marijuana    Comment: per month     Allergies   Patient has no allergy information on record.   Review of Systems Review of Systems  Constitutional: Negative for fever.  Respiratory: Negative for shortness of breath.   Cardiovascular: Negative for chest pain.  Gastrointestinal:  Negative for abdominal pain, constipation, diarrhea, nausea and vomiting.  Skin: Negative for rash and wound.  Allergic/Immunologic: Negative for immunocompromised state.  Neurological: Negative for headaches.  Psychiatric/Behavioral: Positive for hallucinations, sleep disturbance and suicidal ideas. Negative for confusion.  All other systems reviewed and are negative.    Physical Exam Updated Vital Signs BP 119/81 (BP Location: Left Arm)   Pulse 90   Temp 98.5 F (36.9 C) (Oral)   Resp 16   Ht 6' (1.829 m)   Wt 95.3 kg   SpO2 98%   BMI 28.48 kg/m   Physical Exam Vitals signs and nursing note reviewed.  Constitutional:      General: He is not in acute  distress.    Appearance: He is well-developed. He is not diaphoretic.  HENT:     Head: Normocephalic and atraumatic.  Cardiovascular:     Rate and Rhythm: Normal rate and regular rhythm.     Heart sounds: Normal heart sounds.  Pulmonary:     Effort: Pulmonary effort is normal.     Breath sounds: Normal breath sounds.  Abdominal:     Tenderness: There is no abdominal tenderness.  Skin:    General: Skin is warm and dry.  Neurological:     Mental Status: He is alert and oriented to person, place, and time.  Psychiatric:        Mood and Affect: Mood is depressed. Affect is blunt. Affect is not angry or tearful.        Speech: Speech normal.        Behavior: Behavior normal. Behavior is cooperative.        Thought Content: Thought content includes suicidal ideation. Thought content does not include homicidal ideation. Thought content includes suicidal plan. Thought content does not include homicidal plan.      ED Treatments / Results  Labs (all labs ordered are listed, but only abnormal results are displayed) Labs Reviewed  ACETAMINOPHEN LEVEL - Abnormal; Notable for the following components:      Result Value   Acetaminophen (Tylenol), Serum <10 (*)    All other components within normal limits  URINALYSIS, ROUTINE W REFLEX MICROSCOPIC - Abnormal; Notable for the following components:   Color, Urine AMBER (*)    Protein, ur 30 (*)    All other components within normal limits  RAPID URINE DRUG SCREEN, HOSP PERFORMED - Abnormal; Notable for the following components:   Cocaine POSITIVE (*)    Amphetamines POSITIVE (*)    Tetrahydrocannabinol POSITIVE (*)    All other components within normal limits  SARS CORONAVIRUS 2 (HOSPITAL ORDER, PERFORMED IN [facility])  SALICYLATE LEVEL  BASIC METABOLIC PANEL  CBC WITH DIFFERENTIAL/PLATELET  ETHANOL    EKG EKG Interpretation  Date/Time:  Tuesday January 05 2019 13:24:25 EDT Ventricular Rate:  101 PR Interval:  136 QRS  Duration: 88 QT Interval:  346 QTC Calculation: 448 R Axis:   79 Text Interpretation:  Sinus tachycardia Otherwise normal ECG Confirmed by Kristine Royal [phone]) on 01/05/2019 5:14:46 PM   Radiology No results found.  Procedures Procedures (including critical care time)  Medications Ordered in ED Medications  LORazepam (ATIVAN) injection 0-4 mg (0 mg Intravenous Not Given 01/05/19 2246)    Or  LORazepam (ATIVAN) tablet 0-4 mg ( Oral See Alternative 01/05/19 2246)  LORazepam (ATIVAN) injection 0-4 mg (has no administration in time range)    Or  LORazepam (ATIVAN) tablet 0-4 mg (has no administration in time range)  thiamine (VITAMIN  B-1) tablet 100 mg (100 mg Oral Given 01/05/19 2206)    Or  thiamine (B-1) injection 100 mg ( Intravenous See Alternative 01/05/19 2206)     Initial Impression / Assessment and Plan / ED Course  I have reviewed the triage vital signs and the nursing notes.  Pertinent labs & imaging results that were available during my care of the patient were reviewed by me and considered in my medical decision making (see chart for details).  Clinical Course as of Jan 06 23  Tue Jan 05, 2019  [id number] 58 year old male brought in by law enforcement for psychiatric evaluation.  Patient is medically cleared for behavioral health evaluation.  Review of lab work shows urine drug screen positive for cocaine, amphetamines, marijuana.  Alcohol negative, salicylate and Tylenol levels negative, BMP and CBC without significant changes. CIWA scored ordered for monitoring due to report of alcohol use.   [LM]  Wed Jan 06, 2019  0023 IVC paperwork completed but not filed. [facility].    [LM]    Clinical Course User Index [LM] Jeannie FendMurphy, Wiley Flicker A, PA-C      Final Clinical Impressions(s) / ED Diagnoses   Final diagnoses:  Major depressive disorder, remission status unspecified, unspecified whether recurrent    ED Discharge Orders    None        Jeannie FendMurphy, Gadiel John A, PA-C 01/06/19 0025    Wynetta FinesMessick, Peter C, MD 01/06/19 [phone]

## 2019-01-05 NOTE — ED Notes (Signed)
Dinner tray ordered.

## 2019-01-05 NOTE — ED Triage Notes (Signed)
Pt arrived via [employer]. Pt was found today at the top of a tall staircase and was able to be talked off by crisis intervention and police department. Pt states his intention was to commit suicide. Pt endorses drug use and states he has a bipolar diagnosis.

## 2019-01-06 ENCOUNTER — Inpatient Hospital Stay (HOSPITAL_COMMUNITY)
Admission: AD | Admit: 2019-01-06 | Discharge: 2019-01-11 | DRG: 885 | Disposition: A | Discharge status: Rehabilitation Facility | Payer: No Typology Code available for payment source | Attending: Psychiatry | Admitting: Psychiatry

## 2019-01-06 ENCOUNTER — Encounter (HOSPITAL_COMMUNITY): Payer: Self-pay

## 2019-01-06 ENCOUNTER — Other Ambulatory Visit: Payer: Self-pay

## 2019-01-06 DIAGNOSIS — F332 Major depressive disorder, recurrent severe without psychotic features: Secondary | ICD-10-CM | POA: Diagnosis present

## 2019-01-06 DIAGNOSIS — F17210 Nicotine dependence, cigarettes, uncomplicated: Secondary | ICD-10-CM | POA: Diagnosis present

## 2019-01-06 DIAGNOSIS — G8929 Other chronic pain: Secondary | ICD-10-CM | POA: Diagnosis not present

## 2019-01-06 DIAGNOSIS — Z8673 Personal history of transient ischemic attack (TIA), and cerebral infarction without residual deficits: Secondary | ICD-10-CM

## 2019-01-06 DIAGNOSIS — R45851 Suicidal ideations: Secondary | ICD-10-CM | POA: Diagnosis present

## 2019-01-06 DIAGNOSIS — Z1159 Encounter for screening for other viral diseases: Secondary | ICD-10-CM

## 2019-01-06 DIAGNOSIS — F1910 Other psychoactive substance abuse, uncomplicated: Secondary | ICD-10-CM | POA: Diagnosis not present

## 2019-01-06 MED ORDER — CHLORDIAZEPOXIDE HCL 25 MG PO CAPS: 25.0000 mg | ORAL_CAPSULE | Freq: Four times a day (QID) | ORAL | Status: DC | PRN

## 2019-01-06 MED ORDER — MAGNESIUM HYDROXIDE 400 MG/5ML PO SUSP: 30.0000 mL | Freq: Every day | ORAL | Status: DC | PRN

## 2019-01-06 MED ORDER — ALUM & MAG HYDROXIDE-SIMETH 200-200-20 MG/5ML PO SUSP: 30.0000 mL | Freq: Four times a day (QID) | ORAL | Status: DC | PRN

## 2019-01-06 MED ORDER — BENZTROPINE MESYLATE 0.5 MG PO TABS
0.5000 mg | ORAL_TABLET | Freq: Every day | ORAL | Status: DC
  Administered 2019-01-06 – 2019-01-10 (×5): 0.5 mg via ORAL
  Filled 2019-01-06 (×8): qty 1

## 2019-01-06 MED ORDER — HYDROXYZINE HCL 25 MG PO TABS
25.0000 mg | ORAL_TABLET | Freq: Three times a day (TID) | ORAL | Status: DC | PRN
  Administered 2019-01-07 (×2): 25 mg via ORAL
  Filled 2019-01-06 (×3): qty 1

## 2019-01-06 MED ORDER — ACETAMINOPHEN 325 MG PO TABS: 650.0000 mg | ORAL_TABLET | Freq: Four times a day (QID) | ORAL | Status: DC | PRN

## 2019-01-06 MED ORDER — ZOLPIDEM TARTRATE 5 MG PO TABS: 5.0000 mg | ORAL_TABLET | Freq: Every evening | ORAL | Status: DC | PRN

## 2019-01-06 MED ORDER — RISPERIDONE 1 MG PO TABS
1.0000 mg | ORAL_TABLET | Freq: Two times a day (BID) | ORAL | Status: DC
  Administered 2019-01-06 – 2019-01-08 (×5): 1 mg via ORAL
  Filled 2019-01-06 (×9): qty 1

## 2019-01-06 MED ORDER — VITAMIN B-1 100 MG PO TABS
100.0000 mg | ORAL_TABLET | Freq: Every day | ORAL | Status: DC
  Administered 2019-01-06 – 2019-01-10 (×5): 100 mg via ORAL
  Filled 2019-01-06 (×7): qty 1

## 2019-01-06 MED ORDER — GABAPENTIN 300 MG PO CAPS
300.0000 mg | ORAL_CAPSULE | Freq: Three times a day (TID) | ORAL | Status: DC
  Administered 2019-01-06 – 2019-01-08 (×5): 300 mg via ORAL
  Filled 2019-01-06 (×9): qty 1

## 2019-01-06 MED ORDER — ONDANSETRON HCL 4 MG PO TABS: 4.0000 mg | ORAL_TABLET | Freq: Three times a day (TID) | ORAL | Status: DC | PRN

## 2019-01-06 MED ORDER — FOLIC ACID 1 MG PO TABS
1.0000 mg | ORAL_TABLET | Freq: Every day | ORAL | Status: DC
  Administered 2019-01-06 – 2019-01-10 (×5): 1 mg via ORAL
  Filled 2019-01-06 (×7): qty 1

## 2019-01-06 MED ORDER — TRAZODONE HCL 50 MG PO TABS
50.0000 mg | ORAL_TABLET | Freq: Every evening | ORAL | Status: DC | PRN
  Administered 2019-01-10: 50 mg via ORAL
  Filled 2019-01-06 (×2): qty 1

## 2019-01-06 MED ORDER — ACETAMINOPHEN 325 MG PO TABS: 650.0000 mg | ORAL_TABLET | ORAL | Status: DC | PRN

## 2019-01-06 MED ORDER — ALUM & MAG HYDROXIDE-SIMETH 200-200-20 MG/5ML PO SUSP: 30.0000 mL | ORAL | Status: DC | PRN

## 2019-01-06 NOTE — BHH Group Notes (Addendum)
  Billy [name] is a 58 y.o. male brought in by [employer] after being "found at the top of a tall staircase with intentions to commit suicide."  Pt states "I'm tired and today I was going to jump.  I want help but no one will help me."  Pt admits to having suicidal thoughts. "I want to kill my roommate and his girlfriend.  I walk around with a knife, I just want to stab him in the chest.  I hate him for getting me strung out on these drugs."  Pt admits to substance use.  Pt admits to unknown amount of Alcohol, Cannabis, Meth, and Crack Cocaine; last used 01/04/19.  Pt admits to prior inpatient treatment in [place], [place] at [facility].  Pt reports that he has done long-term substance abuse treatment program in [place] and would like to find another program.  Pt resides with a roommate and the roommate's girlfriend.  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   Per counselor.  Patient is passively suicidal with no plan. Contracts for safety. denies HI AVH.  Skin search performed. No contraband found.

## 2019-01-06 NOTE — Tx Team (Signed)
Initial Treatment Plan 01/06/2019 6:37 PM [name] QPR:[id number]    PATIENT STRESSORS: Substance abuse   PATIENT STRENGTHS: Ability for insight Active sense of humor Average or above average intelligence   PATIENT IDENTIFIED PROBLEMS: "start thinking about my kids"  cocaine                   DISCHARGE CRITERIA:  Ability to meet basic life and health needs Adequate post-discharge living arrangements Improved stabilization in mood, thinking, and/or behavior  PRELIMINARY DISCHARGE PLAN: Outpatient therapy Placement in alternative living arrangements  PATIENT/FAMILY INVOLVEMENT: This treatment plan has been presented to and reviewed with the patient, Billy, [name] patient and family have been given the opportunity to ask questions and make suggestions.  [name], RN 01/06/2019, 6:37 PM

## 2019-01-06 NOTE — Progress Notes (Signed)
Pt accepted to [facility], Bed 301-1 Nira Conn, NP is the accepting provider.  Landry Mellow, MD is the attending provider.  Call report to [phone]  Charna Elizabeth [facility] ED notified.   Pt is Voluntary.  Pt may be transported by [facility]  Pt scheduled  to arrive at [facility] as soon as transport can be arranged.  Timmothy Euler. Kaylyn Lim, MSW, LCSW Disposition Clinical Social Work [phone] (cell) [phone] (office)

## 2019-01-06 NOTE — ED Notes (Signed)
Breakfast ordered 

## 2019-01-06 NOTE — Progress Notes (Signed)
Psychoeducational Group Note  Date:  01/06/2019 Time:  2030 Group Topic/Focus:  wrap up group  Participation Level: Did Not Attend  Participation Quality:  Not Applicable  Affect:  Not Applicable  Cognitive:  Not Applicable  Insight:  Not Applicable  Engagement in Group: Not Applicable  Additional Comments:  Pt was notified that group was beginning but remained in bed.   Johann Capers S 01/06/2019, 11:15 PM

## 2019-01-06 NOTE — ED Notes (Signed)
Billy [name] (SR)Lunch Tray Ordered @ 1111-per [place], RN-called by [employer]

## 2019-01-07 DIAGNOSIS — F332 Major depressive disorder, recurrent severe without psychotic features: Principal | ICD-10-CM

## 2019-01-07 MED ORDER — LEVETIRACETAM 500 MG PO TABS
500.0000 mg | ORAL_TABLET | Freq: Two times a day (BID) | ORAL | Status: DC
  Administered 2019-01-07 – 2019-01-10 (×8): 500 mg via ORAL
  Filled 2019-01-07 (×11): qty 1

## 2019-01-07 MED ORDER — CHLORDIAZEPOXIDE HCL 25 MG PO CAPS
25.0000 mg | ORAL_CAPSULE | Freq: Three times a day (TID) | ORAL | Status: AC
  Administered 2019-01-08 (×3): 25 mg via ORAL
  Filled 2019-01-07 (×3): qty 1

## 2019-01-07 MED ORDER — CHLORDIAZEPOXIDE HCL 25 MG PO CAPS
25.0000 mg | ORAL_CAPSULE | ORAL | Status: AC
  Administered 2019-01-09: 25 mg via ORAL
  Filled 2019-01-07: qty 1

## 2019-01-07 MED ORDER — CHLORDIAZEPOXIDE HCL 25 MG PO CAPS
25.0000 mg | ORAL_CAPSULE | Freq: Every day | ORAL | Status: AC
  Administered 2019-01-10: 09:00:00 25 mg via ORAL
  Filled 2019-01-07: qty 1

## 2019-01-07 MED ORDER — CHLORDIAZEPOXIDE HCL 25 MG PO CAPS
25.0000 mg | ORAL_CAPSULE | Freq: Four times a day (QID) | ORAL | Status: AC
  Administered 2019-01-07 (×3): 25 mg via ORAL
  Filled 2019-01-07 (×3): qty 1

## 2019-01-07 NOTE — BHH Suicide Risk Assessment (Signed)
BHH INPATIENT:  Family/Significant Other Suicide Prevention Education  Suicide Prevention Education:  Patient Refusal for Family/Significant Other Suicide Prevention Education: The patient Billy [name] has refused to provide written consent for family/significant other to be provided Family/Significant Other Suicide Prevention Education during admission and/or prior to discharge.  Physician notified.  Delphia Grates 01/07/2019, 11:25 AM

## 2019-01-07 NOTE — Progress Notes (Signed)
D    Pt reports sleeping too much and said he thinks it is the medication he is taking here    It is evening and he thought it was morning   He said his withdrawal symptoms are benig managed by the medication  His behavior is appropriate and he isolates to his room A   Verbal support given   Medications administered and effectiveness monitored   Q 15 min checks R   Pt is safe at this time  [place] NOVEL CORONAVIRUS (COVID-19) DAILY CHECK-OFF SYMPTOMS - answer yes or no to each - every day NO YES  Have you had a fever in the past 24 hours?  . Fever (Temp > 37.80C / 100F) X   Have you had any of these symptoms in the past 24 hours? . New Cough .  Sore Throat  .  Shortness of Breath .  Difficulty Breathing .  Unexplained Body Aches   X   Have you had any one of these symptoms in the past 24 hours not related to allergies?   . Runny Nose .  Nasal Congestion .  Sneezing   X   If you have had runny nose, nasal congestion, sneezing in the past 24 hours, has it worsened?  X   EXPOSURES - check yes or no X   Have you traveled outside the state in the past 14 days?  X   Have you been in contact with someone with a confirmed diagnosis of COVID-19 or PUI in the past 14 days without wearing appropriate PPE?  X   Have you been living in the same home as a person with confirmed diagnosis of COVID-19 or a PUI (household contact)?    X   Have you been diagnosed with COVID-19?    X              What to do next: Answered NO to all: Answered YES to anything:   Proceed with unit schedule Follow the [facility] Inpatient Flowsheet.
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
## 2019-01-07 NOTE — Care Management (Signed)
CMA sent referral to [facility] and [facility].  CMA will follow up with admissions at both facilities and notify LCSW, Stephannie Peters.   [place] Care Management Assistant  Email:[email or web address] Office: [phone]

## 2019-01-07 NOTE — Progress Notes (Signed)
Upon initial assessment pt was in dayroom with peers. Pt rated his day a "3" and his goal was to sleep better tonight. Pt denies SI/HI or hallucinations (a) 15 min checks (r) safety maintained.

## 2019-01-07 NOTE — Progress Notes (Signed)
[address] NOVEL CORONAVIRUS (COVID-19) DAILY CHECK-OFF SYMPTOMS - answer yes or no to each - every day NO YES  Have you had a fever in the past 24 hours?  . Fever (Temp > 37.80C / 100F) X   Have you had any of these symptoms in the past 24 hours? . New Cough .  Sore Throat  .  Shortness of Breath .  Difficulty Breathing .  Unexplained Body Aches   X   Have you had any one of these symptoms in the past 24 hours not related to allergies?   . Runny Nose .  Nasal Congestion .  Sneezing   X   If you have had runny nose, nasal congestion, sneezing in the past 24 hours, has it worsened?  X   EXPOSURES - check yes or no X   Have you traveled outside the state in the past 14 days?  X   Have you been in contact with someone with a confirmed diagnosis of COVID-19 or PUI in the past 14 days without wearing appropriate PPE?  X   Have you been living in the same home as a person with confirmed diagnosis of COVID-19 or a PUI (household contact)?    X   Have you been diagnosed with COVID-19?    X              What to do next: Answered NO to all: Answered YES to anything:   Proceed with unit schedule Follow the [facility] Inpatient Flowsheet.   
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
## 2019-01-07 NOTE — [facility]HH Suicide Risk Assessment (Signed)
[facility] Admission Suicide Risk Assessment   Nursing information obtained from:  Patient Demographic factors:  Male, Low socioeconomic status, Caucasian Current Mental Status:  Suicidal ideation indicated by patient Loss Factors:  Decline in physical health, Financial problems / change in socioeconomic status Historical Factors:  Impulsivity Risk Reduction Factors:  Sense of responsibility to family, Positive therapeutic relationship  Total Time spent with patient: 45 minutes Principal Problem: Alcoholism polysubstance abuse and depression history of seizures history of head injury Diagnosis:  Active Problems:   Severe recurrent major depression without psychotic features (HCC)  Subjective Data: here for detox and stabilization  Continued Clinical Symptoms:  Alcohol Use Disorder Identification Test Final Score (AUDIT): 31 The "Alcohol Use Disorders Identification Test", Guidelines for Use in Primary Care, Second Edition.  World Science writer [facility]). Score between 0-7:  no or low risk or alcohol related problems. Score between 8-15:  moderate risk of alcohol related problems. Score between 16-19:  high risk of alcohol related problems. Score 20 or above:  warrants further diagnostic evaluation for alcohol dependence and treatment.   CLINICAL FACTORS:   Dysthymia   Musculoskeletal: Strength & Muscle Tone: within normal limits Gait & Station: normal Patient leans: N/A  Psychiatric Specialty Exam: Physical Exam  ROS  Blood pressure 92/72, pulse 94, temperature 97.6 F (36.4 C), temperature source Oral, resp. rate 18, height 6' (1.829 m), weight 95.3 kg, SpO2 100 %.Body mass index is 28.48 kg/m.  General Appearance: Casual  Eye Contact:  Fair  Speech:  Clear and Coherent  Volume:  Decreased  Mood:  Depressed  Affect:  Appropriate  Thought Process:  Goal Directed and Descriptions of Associations: Circumstantial  Orientation:  Full (Time, Place, and Person)  Thought Content:   Logical and Tangential  Suicidal Thoughts:  Yes.  without intent/plan  Homicidal Thoughts:  No  Memory:  Immediate;   Fair  Judgement:  Good  Insight:  Good  Psychomotor Activity:  Normal  Concentration:  Concentration: Fair  Recall:  Good  Fund of Knowledge:  Good  Language:  Fair  Akathisia:  Negative  Handed:  Right  AIMS (if indicated):     Assets:  Physical Health Resilience  ADL's:  Intact  Cognition:  WNL  Sleep:  Number of Hours: 5.75      COGNITIVE FEATURES THAT CONTRIBUTE TO RISK:  None    SUICIDE RISK:   Mild:  Suicidal ideation of limited frequency, intensity, duration, and specificity.  There are no identifiable plans, no associated intent, mild dysphoria and related symptoms, good self-control (both objective and subjective assessment), few other risk factors, and identifiable protective factors, including available and accessible social support.  PLAN OF CARE: See orders  I certify that inpatient services furnished can reasonably be expected to improve the patient's condition.   Malvin Johns, MD 01/07/2019, 10:37 AM

## 2019-01-07 NOTE — Progress Notes (Signed)
Patient ID: Billy [name], male   DOB: 1960/10/20, 58 y.o.   MRN: [id number]   [facility] LCSW Group Therapy Note  Date/Time: 06/04/20520 @ 1:30pm  Type of Therapy/Topic:  Group Therapy:  Feelings about Diagnosis  Participation Level:  Did Not Attend   Mood: Did not attend   Description of Group:    This group will allow patients to explore their thoughts and feelings about diagnoses they have received. Patients will be guided to explore their level of understanding and acceptance of these diagnoses. Facilitator will encourage patients to process their thoughts and feelings about the reactions of others to their diagnosis, and will guide patients in identifying ways to discuss their diagnosis with significant others in their lives. This group will be process-oriented, with patients participating in exploration of their own experiences as well as giving and receiving support and challenge from other group members.   Therapeutic Goals: 1. Patient will demonstrate understanding of diagnosis as evidence by identifying two or more symptoms of the disorder:  2. Patient will be able to express two feelings regarding the diagnosis 3. Patient will demonstrate ability to communicate their needs through discussion and/or role plays  Summary of Patient Progress:    Pt did not attend group.    Therapeutic Modalities:   Cognitive Behavioral Therapy Brief Therapy Feelings Identification   Stephannie Peters, LCSW

## 2019-01-07 NOTE — Plan of Care (Signed)
  Problem: Education: Goal: Knowledge of [place] [facility] Education information/materials will improve Outcome: Progressing Goal: Emotional status will improve Outcome: Progressing Goal: Mental status will improve Outcome: Progressing Goal: Verbalization of understanding the information provided will improve Outcome: Progressing   Problem: Activity: Goal: Interest or engagement in activities will improve Outcome: Progressing   

## 2019-01-08 MED ORDER — GABAPENTIN 300 MG PO CAPS
600.0000 mg | ORAL_CAPSULE | Freq: Three times a day (TID) | ORAL | Status: DC
  Administered 2019-01-08 – 2019-01-09 (×3): 600 mg via ORAL
  Filled 2019-01-08 (×9): qty 2

## 2019-01-08 MED ORDER — FLUOXETINE HCL 20 MG PO CAPS
20.0000 mg | ORAL_CAPSULE | Freq: Every day | ORAL | Status: DC
  Administered 2019-01-08 – 2019-01-10 (×3): 20 mg via ORAL
  Filled 2019-01-08 (×5): qty 1

## 2019-01-08 NOTE — Progress Notes (Signed)
[facility] MD Progress Note  01/08/2019 9:14 AM Billy [name]  MRN:  [id number] Subjective:    Patient is in bed for much of the day he reports no acute withdrawal symptoms he denies wanting to harm himself today can contract for safety here is hopeful to go to [facility] Monday does not report current auditory or visual hallucinations no seizure activity. Principal Problem: Polysubstance abuse history of depression and crush injury with chronic pain issues Diagnosis: Active Problems:   Severe recurrent major depression without psychotic features (HCC)  Total Time spent with patient: 45 minutes   Past Medical History:  Past Medical History:  Diagnosis Date  . COPD (chronic obstructive pulmonary disease) (HCC)   . Hepatitis C   . Seizures (HCC)   . Stroke [facility]) 2013   History reviewed. No pertinent surgical history. Family History: History reviewed. No pertinent family history. Family Psychiatric  History: neg Social History:  Social History   Substance and Sexual Activity  Alcohol Use Yes  . Alcohol/week: 8.0 standard drinks  . Types: 8 Cans of beer per week   Comment: per week     Social History   Substance and Sexual Activity  Drug Use Yes  . Frequency: 1.0 times per week  . Types: IV, Methamphetamines, Cocaine, Marijuana   Comment: per month    Social History   Socioeconomic History  . Marital status: Single    Spouse name: Not on file  . Number of children: Not on file  . Years of education: Not on file  . Highest education level: Not on file  Occupational History  . Not on file  Social Needs  . Financial resource strain: Not on file  . Food insecurity:    Worry: Not on file    Inability: Not on file  . Transportation needs:    Medical: Not on file    Non-medical: Not on file  Tobacco Use  . Smoking status: Current Every Day Smoker    Packs/day: 1.00    Years: 25.00    Pack years: 25.00    Types: Cigarettes  . Smokeless tobacco: Never Used  Substance and  Sexual Activity  . Alcohol use: Yes    Alcohol/week: 8.0 standard drinks    Types: 8 Cans of beer per week    Comment: per week  . Drug use: Yes    Frequency: 1.0 times per week    Types: IV, Methamphetamines, Cocaine, Marijuana    Comment: per month  . Sexual activity: Not Currently  Lifestyle  . Physical activity:    Days per week: Not on file    Minutes per session: Not on file  . Stress: Not on file  Relationships  . Social connections:    Talks on phone: Not on file    Gets together: Not on file    Attends religious service: Not on file    Active member of club or organization: Not on file    Attends meetings of clubs or organizations: Not on file    Relationship status: Not on file  Other Topics Concern  . Not on file  Social History Narrative  . Not on file   Additional Social History:                         Sleep: Good  Appetite:  Good  Current Medications: Current Facility-Administered Medications  Medication Dose Route Frequency Provider Last Rate Last Dose  . acetaminophen (TYLENOL) tablet 650  mg  650 mg Oral Q6H PRN Nira Conn A, NP      . alum & mag hydroxide-simeth (MAALOX/MYLANTA) 200-200-20 MG/5ML suspension 30 mL  30 mL Oral Q4H PRN Nira Conn A, NP      . benztropine (COGENTIN) tablet 0.5 mg  0.5 mg Oral Daily Nira Conn A, NP   0.5 mg at 01/08/19 0827  . chlordiazePOXIDE (LIBRIUM) capsule 25 mg  25 mg Oral QID PRN Antonieta Pert, MD      . chlordiazePOXIDE (LIBRIUM) capsule 25 mg  25 mg Oral TID Malvin Johns, MD   25 mg at 01/08/19 0827   Followed by  . [START ON 01/09/2019] chlordiazePOXIDE (LIBRIUM) capsule 25 mg  25 mg Oral Nicki Guadalajara, MD       Followed by  . [START ON 01/10/2019] chlordiazePOXIDE (LIBRIUM) capsule 25 mg  25 mg Oral Daily Malvin Johns, MD      . folic acid (FOLVITE) tablet 1 mg  1 mg Oral Daily Antonieta Pert, MD   1 mg at 01/08/19 0827  . gabapentin (NEURONTIN) capsule 300 mg  300 mg Oral TID  Nira Conn A, NP   300 mg at 01/08/19 0827  . hydrOXYzine (ATARAX/VISTARIL) tablet 25 mg  25 mg Oral TID PRN Jackelyn Poling, NP   25 mg at 01/07/19 2159  . levETIRAcetam (KEPPRA) tablet 500 mg  500 mg Oral BID Malvin Johns, MD   500 mg at 01/08/19 0827  . magnesium hydroxide (MILK OF MAGNESIA) suspension 30 mL  30 mL Oral Daily PRN Nira Conn A, NP      . risperiDONE (RISPERDAL) tablet 1 mg  1 mg Oral BID Nira Conn A, NP   1 mg at 01/08/19 0827  . thiamine (VITAMIN B-1) tablet 100 mg  100 mg Oral Daily Antonieta Pert, MD   100 mg at 01/08/19 0827  . traZODone (DESYREL) tablet 50 mg  50 mg Oral QHS PRN Jackelyn Poling, NP        Lab Results: No results found for this or any previous visit (from the past 48 hour(s)).  Blood Alcohol level:  Lab Results  Component Value Date   ETH <10 01/05/2019   ETH <10 12/20/2018    Metabolic Disorder Labs: Lab Results  Component Value Date   HGBA1C 5.0 12/20/2018   MPG 96.8 12/20/2018   No results found for: PROLACTIN Lab Results  Component Value Date   CHOL 205 (H) 12/20/2018   TRIG 67 12/20/2018   HDL 44 12/20/2018   CHOLHDL 4.7 12/20/2018   VLDL 13 12/20/2018   LDLCALC 148 (H) 12/20/2018    Physical Findings: AIMS:  , ,  ,  ,    CIWA:  CIWA-Ar Total: 2 COWS:     Musculoskeletal: Strength & Muscle Tone: within normal limits Gait & Station: normal Patient leans: N/A  Psychiatric Specialty Exam: Physical Exam  ROS  Blood pressure 109/82, pulse 100, temperature 98.3 F (36.8 C), temperature source Oral, resp. rate 18, height 6' (1.829 m), weight 95.3 kg, SpO2 100 %.Body mass index is 28.48 kg/m.  General Appearance: Casual  Eye Contact:  Poor  Speech:  Slow  Volume:  Decreased  Mood:  Dysphoric  Affect:  Blunt and Congruent  Thought Process:  Coherent and Descriptions of Associations: Intact  Orientation:  Full (Time, Place, and Person)  Thought Content:  Rumination and Tangential  Suicidal Thoughts:  No   Homicidal Thoughts:  No  Memory:  Immediate;  Fair  Judgement:  Good  Insight:  Fair  Psychomotor Activity:  Decreased  Concentration:  Concentration: Fair  Recall:  Fair  Fund of Knowledge:  Fair  Language:  Good  Akathisia:  Negative  Handed:  Right  AIMS (if indicated):     Assets:  Communication Skills Desire for Improvement  ADL's:  Intact  Cognition:  WNL  Sleep:  Number of Hours: 6.5     Treatment Plan Summary: Daily contact with patient to assess and evaluate symptoms and progress in treatment and Medication management continue chlordiazepoxide detox protocol for polysubstance dependency and abuse/add antidepressant for dysphoria and recent suicidal thoughts and plans, continue Keppra for history of seizures continue Risperdal for reports of auditory loose Nations continue Neurontin and escalate dose for reports of chronic pain  Greydis Stlouis, MD 01/08/2019, 9:14 AM

## 2019-01-08 NOTE — Care Management (Signed)
CMA spoke with Lanora Manis, Admissions Coordinator at [facility].   Patient has been accepted for treatment on Monday, 01/11/2019 at 7:45a.   CMA has notified LCSW, Stephannie Peters.   Leavy Heatherly Care Management Assistant  Email:[email or web address] Office: [phone]

## 2019-01-08 NOTE — Progress Notes (Signed)
Recreation Therapy Notes  Date:  6.5.20 Time: 0930 Location: [address] Dayroom  Group Topic: Stress Management  Goal Area(s) Addresses:  Patient will identify positive stress management techniques. Patient will identify benefits of using stress management post d/c.  Intervention: Stress Management  Activity :  Meditation.  LRT introduced the stress management technique of meditation.  LRT played a meditation that guided patients on being resilient in the face of adversity.  Education:  Stress Management, Discharge Planning.   Education Outcome: Acknowledges Education  Clinical Observations/Feedback:  Pt did not attend group session.    Burrel Legrand, LRT/CTRS         Rebekha Diveley A 01/08/2019 11:50 AM 

## 2019-01-08 NOTE — Progress Notes (Signed)
DAR NOTE: Patient presents with flat affect and depressed mood.  Denies suicidal thoughts, pain, auditory and visual hallucinations.  Reports medication making him too sleepy.  Rates depression at 5, hopelessness at 5, and anxiety at 5.  Maintained on routine safety checks.  Medications given as prescribed.  Support and encouragement offered as needed.    Patient stayed in his room for most of this shift.  Patient is safe on and off the unit.

## 2019-01-08 NOTE — Tx Team (Signed)
Interdisciplinary Treatment and Diagnostic Plan Update  01/08/2019 Time of Session: 9:47am Hadley Detloff MRN: [id number]  Principal Diagnosis: <principal problem not specified>  Secondary Diagnoses: Active Problems:   Severe recurrent major depression without psychotic features (HCC)   Current Medications:  Current Facility-Administered Medications  Medication Dose Route Frequency Provider Last Rate Last Dose  . acetaminophen (TYLENOL) tablet 650 mg  650 mg Oral Q6H PRN Nira Conn A, NP      . alum & mag hydroxide-simeth (MAALOX/MYLANTA) 200-200-20 MG/5ML suspension 30 mL  30 mL Oral Q4H PRN Nira Conn A, NP      . benztropine (COGENTIN) tablet 0.5 mg  0.5 mg Oral Daily Nira Conn A, NP   0.5 mg at 01/08/19 0827  . chlordiazePOXIDE (LIBRIUM) capsule 25 mg  25 mg Oral QID PRN Antonieta Pert, MD      . chlordiazePOXIDE (LIBRIUM) capsule 25 mg  25 mg Oral TID Malvin Johns, MD   25 mg at 01/08/19 1241   Followed by  . [START ON 01/09/2019] chlordiazePOXIDE (LIBRIUM) capsule 25 mg  25 mg Oral Nicki Guadalajara, MD       Followed by  . [START ON 01/10/2019] chlordiazePOXIDE (LIBRIUM) capsule 25 mg  25 mg Oral Daily Malvin Johns, MD      . FLUoxetine (PROZAC) capsule 20 mg  20 mg Oral Daily Malvin Johns, MD   20 mg at 01/08/19 1241  . folic acid (FOLVITE) tablet 1 mg  1 mg Oral Daily Antonieta Pert, MD   1 mg at 01/08/19 0827  . gabapentin (NEURONTIN) capsule 600 mg  600 mg Oral TID Malvin Johns, MD   600 mg at 01/08/19 1241  . hydrOXYzine (ATARAX/VISTARIL) tablet 25 mg  25 mg Oral TID PRN Jackelyn Poling, NP   25 mg at 01/07/19 2159  . levETIRAcetam (KEPPRA) tablet 500 mg  500 mg Oral BID Malvin Johns, MD   500 mg at 01/08/19 0827  . magnesium hydroxide (MILK OF MAGNESIA) suspension 30 mL  30 mL Oral Daily PRN Nira Conn A, NP      . risperiDONE (RISPERDAL) tablet 1 mg  1 mg Oral BID Nira Conn A, NP   1 mg at 01/08/19 0827  . thiamine (VITAMIN B-1) tablet 100 mg  100 mg Oral  Daily Antonieta Pert, MD   100 mg at 01/08/19 0827  . traZODone (DESYREL) tablet 50 mg  50 mg Oral QHS PRN Jackelyn Poling, NP       PTA Medications: Medications Prior to Admission  Medication Sig Dispense Refill Last Dose  . ibuprofen (ADVIL) 200 MG tablet Take 800 mg by mouth every 6 (six) hours as needed for moderate pain.   01/03/2019  . benztropine (COGENTIN) 0.5 MG tablet Take 1 tablet (0.5 mg total) by mouth daily. 30 tablet 0   . gabapentin (NEURONTIN) 300 MG capsule Take 1 capsule (300 mg total) by mouth 3 (three) times daily. 90 capsule 0   . risperiDONE (RISPERDAL) 1 MG tablet Take 1 tablet (1 mg total) by mouth 2 (two) times daily. 60 tablet 0   . traZODone (DESYREL) 50 MG tablet Take 1 tablet (50 mg total) by mouth at bedtime as needed for sleep. 30 tablet 0     Patient Stressors: Substance abuse  Patient Strengths: Ability for insight Active sense of humor Average or above average intelligence  Treatment Modalities: Medication Management, Group therapy, Case management,  1 to 1 session with clinician, Psychoeducation, Recreational therapy.  Physician Treatment Plan for Primary Diagnosis: <principal problem not specified> Long Term Goal(s): Improvement in symptoms so as ready for discharge Improvement in symptoms so as ready for discharge   Short Term Goals: Ability to verbalize feelings will improve Ability to identify and develop effective coping behaviors will improve Ability to maintain clinical measurements within normal limits will improve  Medication Management: Evaluate patient's response, side effects, and tolerance of medication regimen.  Therapeutic Interventions: 1 to 1 sessions, Unit Group sessions and Medication administration.  Evaluation of Outcomes: Progressing  Physician Treatment Plan for Secondary Diagnosis: Active Problems:   Severe recurrent major depression without psychotic features (HCC)  Long Term Goal(s): Improvement in symptoms so as  ready for discharge Improvement in symptoms so as ready for discharge   Short Term Goals: Ability to verbalize feelings will improve Ability to identify and develop effective coping behaviors will improve Ability to maintain clinical measurements within normal limits will improve     Medication Management: Evaluate patient's response, side effects, and tolerance of medication regimen.  Therapeutic Interventions: 1 to 1 sessions, Unit Group sessions and Medication administration.  Evaluation of Outcomes: Progressing   RN Treatment Plan for Primary Diagnosis: <principal problem not specified> Long Term Goal(s): Knowledge of disease and therapeutic regimen to maintain health will improve  Short Term Goals: Ability to participate in decision making will improve, Ability to verbalize feelings will improve, Ability to disclose and discuss suicidal ideas, Ability to identify and develop effective coping behaviors will improve and Compliance with prescribed medications will improve  Medication Management: RN will administer medications as ordered by provider, will assess and evaluate patient's response and provide education to patient for prescribed medication. RN will report any adverse and/or side effects to prescribing provider.  Therapeutic Interventions: 1 on 1 counseling sessions, Psychoeducation, Medication administration, Evaluate responses to treatment, Monitor vital signs and CBGs as ordered, Perform/monitor CIWA, COWS, AIMS and Fall Risk screenings as ordered, Perform wound care treatments as ordered.  Evaluation of Outcomes: Progressing   LCSW Treatment Plan for Primary Diagnosis: <principal problem not specified> Long Term Goal(s): Safe transition to appropriate next level of care at discharge, Engage patient in therapeutic group addressing interpersonal concerns.  Short Term Goals: Engage patient in aftercare planning with referrals and resources and Increase skills for wellness  and recovery  Therapeutic Interventions: Assess for all discharge needs, 1 to 1 time with Social worker, Explore available resources and support systems, Assess for adequacy in community support network, Educate family and significant other(s) on suicide prevention, Complete Psychosocial Assessment, Interpersonal group therapy.  Evaluation of Outcomes: Progressing   Progress in Treatment: Attending groups: No. Participating in groups: No. Taking medication as prescribed: Yes. Toleration medication: Yes. Family/Significant other contact made: No, will contact:  pt denied SPE; spe with pt Patient understands diagnosis: Yes. Discussing patient identified problems/goals with staff: Yes. Medical problems stabilized or resolved: Yes. Denies suicidal/homicidal ideation: Yes. Issues/concerns per patient self-inventory: No. Other:   New problem(s) identified: No, Describe:  None  New Short Term/Long Term Goal(s):  Patient Goals:  "Feel better"  Discharge Plan or Barriers: Pt is going to [facility] on Monday, June 8th.  Reason for Continuation of Hospitalization: Medication stabilization  Estimated Length of Stay: 2-3 daud  Attendees: Patient: 01/08/2019   Physician: Dr. Malvin Johns, MD 01/08/2019   Nursing: Lanora Manis, RN 01/08/2019  RN Care Manager: 01/08/2019   Social Worker: Stephannie Peters, LCSW 01/08/2019   Recreational Therapist:  01/08/2019   Other:  01/08/2019  Other:  01/08/2019  Other: 01/08/2019      Scribe for Treatment Team: Delphia GratesJasmine M Renessa Wellnitz, LCSW 01/08/2019 1:26 PM

## 2019-01-08 NOTE — Progress Notes (Signed)
D: Pt was in bed in his room upon initial approach.  Pt presents with depressed affect and mood.  He states "I'm okay."  His goal is "to get up out of this bed but this medication has got me drowsy."  Pt denies SI/HI, denies hallucinations, denies pain.  Pt has been in bed in his room tonight.    A: Introduced self to pt.  Met with pt 1:1.  Actively listened to pt and offered support and encouragement.  Q15 minute safety checks in place.  R: Pt is safe on the unit.  Pt verbally contracts for safety.  Will continue to monitor and assess.

## 2019-01-09 DIAGNOSIS — G8929 Other chronic pain: Secondary | ICD-10-CM

## 2019-01-09 DIAGNOSIS — F1910 Other psychoactive substance abuse, uncomplicated: Secondary | ICD-10-CM

## 2019-01-09 MED ORDER — BENZTROPINE MESYLATE 0.5 MG PO TABS
0.5000 mg | ORAL_TABLET | Freq: Every day | ORAL | Status: DC
  Filled 2019-01-09 (×2): qty 28

## 2019-01-09 MED ORDER — FLUOXETINE HCL 20 MG PO CAPS
20.0000 mg | ORAL_CAPSULE | Freq: Every day | ORAL | Status: DC
  Filled 2019-01-09: qty 14

## 2019-01-09 MED ORDER — RISPERIDONE 1 MG PO TABS
1.0000 mg | ORAL_TABLET | Freq: Two times a day (BID) | ORAL | Status: DC
  Filled 2019-01-09: qty 28

## 2019-01-09 MED ORDER — GABAPENTIN 600 MG PO TABS
600.0000 mg | ORAL_TABLET | Freq: Three times a day (TID) | ORAL | Status: DC
  Filled 2019-01-09 (×3): qty 42

## 2019-01-09 MED ORDER — GABAPENTIN 300 MG PO CAPS
300.0000 mg | ORAL_CAPSULE | Freq: Three times a day (TID) | ORAL | Status: DC
  Administered 2019-01-10 (×3): 300 mg via ORAL
  Filled 2019-01-09 (×2): qty 42
  Filled 2019-01-09 (×4): qty 1
  Filled 2019-01-09: qty 42

## 2019-01-09 MED ORDER — LEVETIRACETAM 500 MG PO TABS
500.0000 mg | ORAL_TABLET | Freq: Two times a day (BID) | ORAL | Status: DC
  Filled 2019-01-09: qty 28

## 2019-01-09 MED ORDER — RISPERIDONE 0.5 MG PO TABS
0.5000 mg | ORAL_TABLET | Freq: Two times a day (BID) | ORAL | Status: DC
  Administered 2019-01-09 – 2019-01-10 (×3): 0.5 mg via ORAL
  Filled 2019-01-09 (×3): qty 28
  Filled 2019-01-09 (×4): qty 1
  Filled 2019-01-09: qty 28

## 2019-01-09 NOTE — Progress Notes (Signed)
[place] NOVEL CORONAVIRUS (COVID-19) DAILY CHECK-OFF SYMPTOMS - answer yes or no to each - every day NO YES  Have you had a fever in the past 24 hours?  . Fever (Temp > 37.80C / 100F) X   Have you had any of these symptoms in the past 24 hours? . New Cough .  Sore Throat  .  Shortness of Breath .  Difficulty Breathing .  Unexplained Body Aches   X   Have you had any one of these symptoms in the past 24 hours not related to allergies?   . Runny Nose .  Nasal Congestion .  Sneezing   X   If you have had runny nose, nasal congestion, sneezing in the past 24 hours, has it worsened?  X   EXPOSURES - check yes or no X   Have you traveled outside the state in the past 14 days?  X   Have you been in contact with someone with a confirmed diagnosis of COVID-19 or PUI in the past 14 days without wearing appropriate PPE?  X   Have you been living in the same home as a person with confirmed diagnosis of COVID-19 or a PUI (household contact)?    X   Have you been diagnosed with COVID-19?    X              What to do next: Answered NO to all: Answered YES to anything:   Proceed with unit schedule Follow the [facility] Inpatient Flowsheet.   

## 2019-01-09 NOTE — Progress Notes (Signed)
Pt is seen sitting in wheelchair in dayroom upon initial approach.  He reports he "almost passed out" on multiple occasions today.  He expresses that he would like to stay in dayroom at this time.  He denies SI/HI.  He reports AH of "voices" that are "not as bad."  Pt did not specify further.  He has been interacting with staff and peers appropriately.    Introduced self to pt.  Actively listened to pt and offered support and encouragement.  Medication administered per order.  Fall prevention techniques reviewed with pt and he verbalized understanding.  15 minute safety checks in place.  Pt is compliant with medication.  He verbally contracts for safety and reports he will inform staff of needs and concerns.  Will continue to monitor and assess.

## 2019-01-09 NOTE — Progress Notes (Signed)
[facility] Group Notes:  (Nursing/MHT/Case Management/Adjunct)  Date:  01/09/2019  Time:  2030  Type of Therapy:  wrap up group  Participation Level:  Active  Participation Quality:  Appropriate, Attentive, Drowsy, Sharing and Supportive  Affect:  Blunted and Lethargic  Cognitive:  Appropriate  Insight:  Improving  Engagement in Group:  Engaged  Modes of Intervention:  Clarification, Education and Support  Summary of Progress/Problems: Pt shared that he spoke with social worker about going to further inpatient treatment. Pt reports not remembering the name or how many days it would be. If pt could change any one thing about his life it would be to have a better relationship with his family (kids). Pt reports being grateful for his three grandchildren.   Winfield Rast S 01/09/2019, 9:24 PM

## 2019-01-09 NOTE — [facility] Group Notes (Signed)
[facility] Group Notes: (Clinical Social Work)   01/09/2019      Type of Therapy:  Group Therapy   Participation Level:  Did Not Attend - was invited both individually by MHT and by overhead announcement, chose not to attend.   Selmer Dominion, LCSW 01/09/2019, 11:11 AM

## 2019-01-09 NOTE — Progress Notes (Signed)
D Pt is observed sleeping in his bed. He is breathing calmly. He does not awaken when this writer calls his name softly. He does open his eyes- a little- when this writer gently shook his shoulder, he is not able to stay awake long enough to take his 0800 medications. Writer requested MD reassess his medicaiton dosage .      A He awakended around lunch, MD adjusted and decreased meds and pt took per MD order.r Pt stood ( immediately after eating) and called for writer to help. Pt became diaphoretic, unsteady on his feet and weak and dizzy. Pt assisted to get in wheelchiar. BP 102/71 and HRR 97. Pt never lost consciousness and said " it's the medicine...ibuprofen havent been eating and drinking good..Internal Medicine ok:. RN notified MD and pt assisted to get in bed and agreed to call for help if / when he needed to get OOB. Pt asleep from 1300- to dinnertime. He walked to cafe, at dinner and after eating he was klighheaded again. Writer notified MD and neurontin was decreased ( and held for dinner) and pt given keppra and risperdal per MD orde. Pt able and willing to follow staff instruciton. He says he will call for staff  when he needs to get up. He said " I dont want to stay in the bed any longer..6:59 PM that Im waking up". He is currently witting in hte dayroom sitting in wheelchair, watching TV.     R Safety in place.

## 2019-01-09 NOTE — Progress Notes (Signed)
[facility]  01/09/2019 10:26 AM Billy [name]  MRN:  [id number]   Subjective:  Billy BurdockRichard reports " just tired, this medication is strong I think, all I do is sleep."  Evaluations:  Billy [name] seen resting in bed.  He presents flat, guarded, disheveled and irritable.  Reports his depression related to COVID and recent job loss. Patient [place] from [place] 2 [place] prior.   Patient reports sleeping for the last 4 to 5 days due to oversedation with medication.  Decrease Risperdal  1 mg to 0.5 mg p.o. twice daily.  Patient denies alcohol withdrawal symptoms or cravings.  Patient is on day 3 Librium detox protocol.  Billy [name] reports he is eager to attend rehab on Monday.  Denies suicidal or homicidal ideations.  Denies auditory or visual hallucinations.     Principal Problem: Polysubstance abuse history of depression and crush injury with chronic pain issues  Diagnosis: Active Problems:   Severe recurrent major depression without psychotic features (HCC)  Total Time spent with patient: 45 minutes   Past Medical History:  Past Medical History:  Diagnosis Date  . COPD (chronic obstructive pulmonary disease) (HCC)   . Hepatitis C   . Seizures (HCC)   . Stroke [facility](HCC) 2013   History reviewed. No pertinent surgical history. Family History: History reviewed. No pertinent family history. Family Psychiatric  History: neg Social History:  Social History   Substance and Sexual Activity  Alcohol Use Yes  . Alcohol/week: 8.0 standard drinks  . Types: 8 Cans of beer per week   Comment: per week     Social History   Substance and Sexual Activity  Drug Use Yes  . Frequency: 1.0 times per week  . Types: IV, Methamphetamines, Cocaine, Marijuana   Comment: per month    Social History   Socioeconomic History  . Marital status: Single    Spouse name: Not on file  . Number of children: Not on file  . Years of education: Not on file  . Highest education level: Not on file   Occupational History  . Not on file  Social Needs  . Financial resource strain: Not on file  . Food insecurity:    Worry: Not on file    Inability: Not on file  . Transportation needs:    Medical: Not on file    Non-medical: Not on file  Tobacco Use  . Smoking status: Current Every Day Smoker    Packs/day: 1.00    Years: 25.00    Pack years: 25.00    Types: Cigarettes  . Smokeless tobacco: Never Used  Substance and Sexual Activity  . Alcohol use: Yes    Alcohol/week: 8.0 standard drinks    Types: 8 Cans of beer per week    Comment: per week  . Drug use: Yes    Frequency: 1.0 times per week    Types: IV, Methamphetamines, Cocaine, Marijuana    Comment: per month  . Sexual activity: Not Currently  Lifestyle  . Physical activity:    Days per week: Not on file    Minutes per session: Not on file  . Stress: Not on file  Relationships  . Social connections:    Talks on phone: Not on file    Gets together: Not on file    Attends religious service: Not on file    Active member of club or organization: Not on file    Attends meetings of clubs or organizations: Not on file  Relationship status: Not on file  Other Topics Concern  . Not on file  Social History Narrative  . Not on file   Additional Social History:                         Sleep: Good  Appetite:  Good  Current Medications: Current Facility-Administered Medications  Medication Dose Route Frequency Provider Last Rate Last Dose  . acetaminophen (TYLENOL) tablet 650 mg  650 mg Oral Q6H PRN Lindon Romp A, NP      . alum & mag hydroxide-simeth (MAALOX/MYLANTA) 200-200-20 MG/5ML suspension 30 mL  30 mL Oral Q4H PRN Lindon Romp A, NP      . benztropine (COGENTIN) tablet 0.5 mg  0.5 mg Oral Daily Lindon Romp A, NP   0.5 mg at 01/08/19 0827  . chlordiazePOXIDE (LIBRIUM) capsule 25 mg  25 mg Oral QID PRN Sharma Covert, MD      . chlordiazePOXIDE (LIBRIUM) capsule 25 mg  25 mg Oral Celine Ahr, MD       Followed by  . [START ON 01/10/2019] chlordiazePOXIDE (LIBRIUM) capsule 25 mg  25 mg Oral Daily Johnn Hai, MD      . FLUoxetine (PROZAC) capsule 20 mg  20 mg Oral Daily Johnn Hai, MD   20 mg at 01/08/19 1241  . folic acid (FOLVITE) tablet 1 mg  1 mg Oral Daily Sharma Covert, MD   1 mg at 01/08/19 0827  . gabapentin (NEURONTIN) capsule 600 mg  600 mg Oral TID Johnn Hai, MD   600 mg at 01/08/19 1736  . hydrOXYzine (ATARAX/VISTARIL) tablet 25 mg  25 mg Oral TID PRN Rozetta Nunnery, NP   25 mg at 01/07/19 2159  . levETIRAcetam (KEPPRA) tablet 500 mg  500 mg Oral BID Johnn Hai, MD   500 mg at 01/08/19 1735  . magnesium hydroxide (MILK OF MAGNESIA) suspension 30 mL  30 mL Oral Daily PRN Lindon Romp A, NP      . risperiDONE (RISPERDAL) tablet 1 mg  1 mg Oral BID Lindon Romp A, NP   1 mg at 01/08/19 1735  . thiamine (VITAMIN B-1) tablet 100 mg  100 mg Oral Daily Sharma Covert, MD   100 mg at 01/08/19 0827  . traZODone (DESYREL) tablet 50 mg  50 mg Oral QHS PRN Rozetta Nunnery, NP        Lab Results: No results found for this or any previous visit (from the past 48 hour(s)).  Blood Alcohol level:  Lab Results  Component Value Date   ETH <10 01/05/2019   ETH <10 83/15/1761    Metabolic Disorder Labs: Lab Results  Component Value Date   HGBA1C 5.0 12/20/2018   MPG 96.8 12/20/2018   No results found for: PROLACTIN Lab Results  Component Value Date   CHOL 205 (H) 12/20/2018   TRIG 67 12/20/2018   HDL 44 12/20/2018   CHOLHDL 4.7 12/20/2018   VLDL 13 12/20/2018   LDLCALC 148 (H) 12/20/2018    Physical Findings: AIMS: Facial and Oral Movements Muscles of Facial Expression: None, normal Lips and Perioral Area: None, normal Jaw: None, normal Tongue: None, normal,Extremity Movements Upper (arms, wrists, hands, fingers): None, normal Lower (legs, knees, ankles, toes): None, normal, Trunk Movements Neck, shoulders, hips: None, normal, Overall  Severity Severity of abnormal movements (highest score from questions above): None, normal Incapacitation due to abnormal movements: None, normal Patient's awareness of abnormal movements (  rate only patient's report): No Awareness, Dental Status Current problems with teeth and/or dentures?: No Does patient usually wear dentures?: No  CIWA:  CIWA-Ar Total: 2 COWS:     Musculoskeletal: Strength & Muscle Tone: within normal limits Gait & Station: normal Patient leans: N/A  Psychiatric Specialty Exam: Physical Exam  Constitutional: He appears well-developed.    Review of Systems  Psychiatric/Behavioral: Positive for depression and substance abuse. Negative for suicidal ideas. The patient is nervous/anxious.   All other systems reviewed and are negative.   Blood pressure 101/67, pulse 76, temperature 98.3 F (36.8 C), temperature source Oral, resp. rate 18, height 6' (1.829 m), weight 95.3 kg, SpO2 100 %.Body mass index is 28.48 kg/m.  General Appearance: Disheveled  Eye Contact:  Fair  Speech:  Clear and Coherent  Volume:  Normal  Mood:  Dysphoric  Affect:  Blunt, Congruent and Depressed  Thought Process:  Coherent and Linear  Orientation:  Full (Time, Place, and Person)  Thought Content:  Rumination  Suicidal Thoughts:  No  Homicidal Thoughts:  No  Memory:  Immediate;   Fair  Judgement:  Good  Insight:  Fair  Psychomotor Activity:  Decreased  Concentration:  Concentration: Fair  Recall:  Fair  Fund of Knowledge:  Fair  Language:  Good  Akathisia:  Negative  Handed:  Right  AIMS (if indicated):     Assets:  Communication Skills Desire for Improvement  ADL's:  Intact  Cognition:  WNL  Sleep:  Number of Hours: 5.75     Treatment Plan Summary: Daily contact with patient to assess and evaluate symptoms and progress in treatment and Medication management   Continue with current treatment plan on 01/09/2019 as listed below except were noted  Decrease Risperdal 1  milligram twice daily to 0.5 mg Continue Prozac 20 mg p.o. daily Continue trazodone 50 mg p.o. nightly Continue with Neurontin 600 mg p.o. 3 times daily Continue Vistaril 25 mg p.o. 3 times daily Continue Keppra 50 mg p.o. twice daily  Continue CIWA Librium protocol  -Librium 25 mg as indicated.  CSW to continue working on discharge disposition ([facility] scheduled for 01/11/2019) Patient encouraged to participate within the therapeutic milieu  Oneta Rackanika N Brittiney Dicostanzo, NP 01/09/2019, 10:26 AM

## 2019-01-09 NOTE — Plan of Care (Signed)
  Problem: Education: Goal: Knowledge of [place] [facility] Education information/materials will improve Outcome: Not Progressing Goal: Emotional status will improve Outcome: Not Progressing   

## 2019-01-09 NOTE — [facility] Group Notes (Signed)
[place] Group Notes:  (Nursing)  Date:  01/09/2019  Time:  200 PM Type of Therapy:  Nurse Education  Participation Level:  Did Not Attend    Billy [name] 01/09/2019, 6:06 PM

## 2019-01-10 MED ORDER — GABAPENTIN 300 MG PO CAPS: 300.0000 mg | ORAL_CAPSULE | Freq: Three times a day (TID) | ORAL | 0 refills | Status: AC

## 2019-01-10 MED ORDER — FLUOXETINE HCL 20 MG PO CAPS: 20.0000 mg | ORAL_CAPSULE | Freq: Every day | ORAL | 0 refills | Status: AC

## 2019-01-10 MED ORDER — HYDROXYZINE HCL 25 MG PO TABS: 25.0000 mg | ORAL_TABLET | Freq: Three times a day (TID) | ORAL | 0 refills | Status: AC | PRN

## 2019-01-10 MED ORDER — TRAZODONE HCL 50 MG PO TABS: 50.0000 mg | ORAL_TABLET | Freq: Every evening | ORAL | 0 refills | Status: AC | PRN

## 2019-01-10 MED ORDER — BENZTROPINE MESYLATE 0.5 MG PO TABS: 0.5000 mg | ORAL_TABLET | Freq: Every day | ORAL | 0 refills | Status: AC

## 2019-01-10 MED ORDER — LEVETIRACETAM 500 MG PO TABS: 500.0000 mg | ORAL_TABLET | Freq: Two times a day (BID) | ORAL | 0 refills | Status: AC

## 2019-01-10 MED ORDER — RISPERIDONE 0.5 MG PO TABS: 0.5000 mg | ORAL_TABLET | Freq: Two times a day (BID) | ORAL | 0 refills | Status: AC

## 2019-01-10 NOTE — Plan of Care (Signed)
  Problem: Education: Goal: Mental status will improve Outcome: Progressing   

## 2019-01-10 NOTE — Progress Notes (Signed)
[place] NOVEL CORONAVIRUS (COVID-19) DAILY CHECK-OFF SYMPTOMS - answer yes or no to each - every day NO YES  Have you had a fever in the past 24 hours?  . Fever (Temp > 37.80C / 100F) X   Have you had any of these symptoms in the past 24 hours? . New Cough .  Sore Throat  .  Shortness of Breath .  Difficulty Breathing .  Unexplained Body Aches   X   Have you had any one of these symptoms in the past 24 hours not related to allergies?   . Runny Nose .  Nasal Congestion .  Sneezing   X   If you have had runny nose, nasal congestion, sneezing in the past 24 hours, has it worsened?  X   EXPOSURES - check yes or no X   Have you traveled outside the state in the past 14 days?  X   Have you been in contact with someone with a confirmed diagnosis of COVID-19 or PUI in the past 14 days without wearing appropriate PPE?  X   Have you been living in the same home as a person with confirmed diagnosis of COVID-19 or a PUI (household contact)?    X   Have you been diagnosed with COVID-19?    X              What to do next: Answered NO to all: Answered YES to anything:   Proceed with unit schedule Follow the [facility] Inpatient Flowsheet.   

## 2019-01-10 NOTE — Progress Notes (Signed)
[place] Group Notes:  (Nursing/MHT/Case Management/Adjunct)  Date:  01/10/2019  Time:  2030  Type of Therapy:  wrap up group  Participation Level:  Active  Participation Quality:  Drowsy, Sharing and Supportive  Affect:  Appropriate  Cognitive:  Appropriate  Insight:  Improving  Engagement in Group:  Engaged  Modes of Intervention:  Clarification, Education and Support  Summary of Progress/Problems: Pt plans to avoid all the people, places, and things associated with his substance abuse. Pt is grateful for peace of mind and sense of self worth.   Winfield Rast S 01/10/2019, 9:30 PM

## 2019-01-10 NOTE — Progress Notes (Signed)
  [facility] Adult Case Management Discharge Plan :  Will you be returning to the same living situation after discharge:  No.  Going to rehab At discharge, do you have transportation home?: Yes,  taxi voucher provided Do you have the ability to pay for your medications: No.  No insurance  Release of information consent forms completed and turned in to Medical Records by CSW.   Patient to Follow up at: Follow-up Information    [facility] Follow up on 01/11/2019.   Why:  Please attend your screening for admission on Monday, 6/8 at 7:45a.  Be sure to bring your photo ID, proof of residency, 30-day supply of medication and 2 weeks worth of clothing.  Contact information: [address] [place] [address] [phone]           Next level of care provider has access to [place] and Suicide Prevention discussed: No. Declined by patient, done just with him  Have you used any form of tobacco in the last 30 days? (Cigarettes, Smokeless Tobacco, Cigars, and/or Pipes): Yes  Has patient been referred to the Quitline?: Patient refused referral  Patient has been referred for addiction treatment: Yes  Maretta Los, LCSW 01/10/2019, 12:44 PM

## 2019-01-10 NOTE — Progress Notes (Addendum)
D Patient is observed standing t the med window..he wears hospital -issue patient scrubs. His skin and  hair are damp and visibly cleaner. He says  " where are my clothese .Have  you gotten  them  ready yet?" . He makes direct eye contact with this Probation officer and he says " boy..I cant even believe how much better Im feeling today..my depression is getting better too!">     A HE takes his scheudled meds as planned.     R Safety is in place.  Addendum" per LCSW pt needs  to be discharged tomorrow am ( 0630) so he can arrive at [facility] for admission process. All dc paperwork printed by this writer and placed on the front of his chart, as well as taxi cab voucher . Pt made aware by this Probation officer.
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
## 2019-01-10 NOTE — Progress Notes (Signed)
[place] NOVEL CORONAVIRUS (COVID-19) DAILY CHECK-OFF SYMPTOMS - answer yes or no to each - every day NO YES  Have you had a fever in the past 24 hours?  . Fever (Temp > 37.80C / 100F) X   Have you had any of these symptoms in the past 24 hours? . New Cough .  Sore Throat  .  Shortness of Breath .  Difficulty Breathing .  Unexplained Body Aches   X   Have you had any one of these symptoms in the past 24 hours not related to allergies?   . Runny Nose .  Nasal Congestion .  Sneezing   X   If you have had runny nose, nasal congestion, sneezing in the past 24 hours, has it worsened?  X   EXPOSURES - check yes or no X   Have you traveled outside the state in the past 14 days?  X   Have you been in contact with someone with a confirmed diagnosis of COVID-19 or PUI in the past 14 days without wearing appropriate PPE?  X   Have you been living in the same home as a person with confirmed diagnosis of COVID-19 or a PUI (household contact)?    X   Have you been diagnosed with COVID-19?    X              What to do next: Answered NO to all: Answered YES to anything:   Proceed with unit schedule Follow the [facility] Inpatient Flowsheet.   

## 2019-01-10 NOTE — Progress Notes (Signed)
[facility]  01/10/2019 9:41 AM Billy PalingRichard [name]  MRN:  [id number]   Subjective:  Billy [name] reports " feeling a lot better since my medications and not that strong"  Evaluations:  Billy [name] seen standing at the nurses station.  He is awake alert and oriented x3.  Presents with a brighter affect than on yesterday.  Reports overall his mood has improved and feels less sedated.  Patient is [facility].  Patient reports he is eager to attend.  Reports he is hopeful to get his life back on track.  Denies suicidal or homicidal ideations.  Denies auditory visual hallucinations.  Reports mild depression.  Denies alcohol withdrawal symptoms and/or cravings.  Reports a good appetite.  States he is resting well throughout the night.  Support encouragement reassurance was provided.  Principal Problem: Polysubstance abuse history of depression and crush injury with chronic pain issues  Diagnosis: Active Problems:   Severe recurrent major depression without psychotic features (HCC)  Total Time spent with patient: 45 minutes   Past Medical History:  Past Medical History:  Diagnosis Date  . COPD (chronic obstructive pulmonary disease) (HCC)   . Hepatitis C   . Seizures (HCC)   . Stroke [facility](HCC) 2013   History reviewed. No pertinent surgical history. Family History: History reviewed. No pertinent family history. Family Psychiatric  History: neg Social History:  Social History   Substance and Sexual Activity  Alcohol Use Yes  . Alcohol/week: 8.0 standard drinks  . Types: 8 Cans of beer per week   Comment: per week     Social History   Substance and Sexual Activity  Drug Use Yes  . Frequency: 1.0 times per week  . Types: IV, Methamphetamines, Cocaine, Marijuana   Comment: per month    Social History   Socioeconomic History  . Marital status: Single    Spouse name: Not on file  . Number of children: Not on file  .  Years of education: Not on file  . Highest education level: Not on file  Occupational History  . Not on file  Social Needs  . Financial resource strain: Not on file  . Food insecurity:    Worry: Not on file    Inability: Not on file  . Transportation needs:    Medical: Not on file    Non-medical: Not on file  Tobacco Use  . Smoking status: Current Every Day Smoker    Packs/day: 1.00    Years: 25.00    Pack years: 25.00    Types: Cigarettes  . Smokeless tobacco: Never Used  Substance and Sexual Activity  . Alcohol use: Yes    Alcohol/week: 8.0 standard drinks    Types: 8 Cans of beer per week    Comment: per week  . Drug use: Yes    Frequency: 1.0 times per week    Types: IV, Methamphetamines, Cocaine, Marijuana    Comment: per month  . Sexual activity: Not Currently  Lifestyle  . Physical activity:    Days per week: Not on file    Minutes per session: Not on file  . Stress: Not on file  Relationships  . Social connections:    Talks on phone: Not on file    Gets together: Not on file    Attends religious service: Not on file    Active member of club or organization: Not on file    Attends meetings of clubs or organizations: Not  on file    Relationship status: Not on file  Other Topics Concern  . Not on file  Social History Narrative  . Not on file   Additional Social History:                         Sleep: Good  Appetite:  Good  Current Medications: Current Facility-Administered Medications  Medication Dose Route Frequency Provider Last Rate Last Dose  . acetaminophen (TYLENOL) tablet 650 mg  650 mg Oral Q6H PRN Lindon Romp A, NP      . alum & mag hydroxide-simeth (MAALOX/MYLANTA) 200-200-20 MG/5ML suspension 30 mL  30 mL Oral Q4H PRN Lindon Romp A, NP      . benztropine (COGENTIN) tablet 0.5 mg  0.5 mg Oral Daily Lindon Romp A, NP   0.5 mg at 01/10/19 1829  . chlordiazePOXIDE (LIBRIUM) capsule 25 mg  25 mg Oral QID PRN Sharma Covert, MD       . FLUoxetine (PROZAC) capsule 20 mg  20 mg Oral Daily Johnn Hai, MD   20 mg at 01/10/19 0834  . folic acid (FOLVITE) tablet 1 mg  1 mg Oral Daily Sharma Covert, MD   1 mg at 01/10/19 0834  . gabapentin (NEURONTIN) capsule 300 mg  300 mg Oral TID Sharma Covert, MD   300 mg at 01/10/19 9371  . hydrOXYzine (ATARAX/VISTARIL) tablet 25 mg  25 mg Oral TID PRN Rozetta Nunnery, NP   25 mg at 01/07/19 2159  . levETIRAcetam (KEPPRA) tablet 500 mg  500 mg Oral BID Johnn Hai, MD   500 mg at 01/10/19 0834  . magnesium hydroxide (MILK OF MAGNESIA) suspension 30 mL  30 mL Oral Daily PRN Lindon Romp A, NP      . risperiDONE (RISPERDAL) tablet 0.5 mg  0.5 mg Oral BID Derrill Center, NP   0.5 mg at 01/10/19 0834  . thiamine (VITAMIN B-1) tablet 100 mg  100 mg Oral Daily Sharma Covert, MD   100 mg at 01/10/19 6967  . traZODone (DESYREL) tablet 50 mg  50 mg Oral QHS PRN Rozetta Nunnery, NP        Lab Results: No results found for this or any previous visit (from the past 48 hour(s)).  Blood Alcohol level:  Lab Results  Component Value Date   ETH <10 01/05/2019   ETH <10 89/38/1017    Metabolic Disorder Labs: Lab Results  Component Value Date   HGBA1C 5.0 12/20/2018   MPG 96.8 12/20/2018   No results found for: PROLACTIN Lab Results  Component Value Date   CHOL 205 (H) 12/20/2018   TRIG 67 12/20/2018   HDL 44 12/20/2018   CHOLHDL 4.7 12/20/2018   VLDL 13 12/20/2018   LDLCALC 148 (H) 12/20/2018    Physical Findings: AIMS: Facial and Oral Movements Muscles of Facial Expression: None, normal Lips and Perioral Area: None, normal Jaw: None, normal Tongue: None, normal,Extremity Movements Upper (arms, wrists, hands, fingers): None, normal Lower (legs, knees, ankles, toes): None, normal, Trunk Movements Neck, shoulders, hips: None, normal, Overall Severity Severity of abnormal movements (highest score from questions above): None, normal Incapacitation due to abnormal  movements: None, normal Patient's awareness of abnormal movements (rate only patient's report): No Awareness, Dental Status Current problems with teeth and/or dentures?: No Does patient usually wear dentures?: No  CIWA:  CIWA-Ar Total: 2 COWS:     Musculoskeletal: Strength & Muscle Tone: within normal  limits Gait & Station: normal Patient leans: N/A  Psychiatric Specialty Exam: Physical Exam  Nursing note and vitals reviewed. Constitutional: He appears well-developed.  Psychiatric: He has a normal mood and affect. His behavior is normal.    Review of Systems  Psychiatric/Behavioral: Positive for depression and substance abuse. Negative for suicidal ideas. The patient is nervous/anxious.   All other systems reviewed and are negative.   Blood pressure 97/83, pulse 91, temperature 97.7 F (36.5 C), temperature source Oral, resp. rate 20, height 6' (1.829 m), weight 95.3 kg, SpO2 99 %.Body mass index is 28.48 kg/m.  General Appearance: Casual  Eye Contact:  Good  Speech:  Clear and Coherent  Volume:  Normal  Mood:  Anxious and Depressed improved  Affect:  Appropriate and Congruent  Thought Process:  Coherent and Linear  Orientation:  Full (Time, Place, and Person)  Thought Content:  Rumination  Suicidal Thoughts:  No  Homicidal Thoughts:  No  Memory:  Immediate;   Fair  Judgement:  Good  Insight:  Fair  Psychomotor Activity:  Decreased  Concentration:  Concentration: Fair  Recall:  Fair  Fund of Knowledge:  Fair  Language:  Good  Akathisia:  Negative  Handed:  Right  AIMS (if indicated):     Assets:  Communication Skills Desire for Improvement  ADL's:  Intact  Cognition:  WNL  Sleep:  Number of Hours: 6.75     Treatment Plan Summary: Daily contact with patient to assess and evaluate symptoms and progress in treatment and Medication management   Continue with current treatment plan on 01/10/2019 as listed below except were noted  Continue Risperdal 0.5 mg BID    Continue Prozac 20 mg p.o. daily Continue trazodone 50 mg p.o. nightly Continue with Neurontin 600 mg p.o. 3 times daily Continue Vistaril 25 mg p.o. 3 times daily Continue Keppra 50 mg p.o. twice daily  Continue CIWA Librium protocol  -Librium 25 mg as indicated.  CSW to continue working on discharge disposition ([facility] scheduled for 01/11/2019) Patient encouraged to participate within the therapeutic milieu  Oneta Rackanika N Lewis, NP 01/10/2019, 9:41 AM

## 2019-01-10 NOTE — [facility] Suicide Risk Assessment (Signed)
[facility] Discharge Suicide Risk Assessment   Principal Problem: Severe recurrent major depression without psychotic features [facility]) Discharge Diagnoses: Principal Problem:   Severe recurrent major depression without psychotic features ([place])   Total Time spent with patient: 15 minutes  Musculoskeletal: Strength & Muscle Tone: within normal limits Gait & Station: normal Patient leans: N/A  Psychiatric Specialty Exam: Review of Systems  All other systems reviewed and are negative.   Blood pressure 104/67, pulse 82, temperature 98.7 F (37.1 C), temperature source Oral, resp. rate 20, height 6' (1.829 m), weight 95.3 kg, SpO2 99 %.Body mass index is 28.48 kg/m.  General Appearance: Casual  Eye Contact::  Fair  Speech:  Normal Rate409  Volume:  Normal  Mood:  Anxious  Affect:  Congruent  Thought Process:  Coherent and Descriptions of Associations: Intact  Orientation:  Full (Time, Place, and Person)  Thought Content:  Logical  Suicidal Thoughts:  No  Homicidal Thoughts:  No  Memory:  Immediate;   Fair Recent;   Fair Remote;   Fair  Judgement:  Intact  Insight:  Fair  Psychomotor Activity:  Normal  Concentration:  Good  Recall:  Good  Fund of Knowledge:Good  Language: Good  Akathisia:  Negative  Handed:  Right  AIMS (if indicated):     Assets:  Desire for Improvement Resilience  Sleep:  Number of Hours: 6.75  Cognition: WNL  ADL's:  Intact   Mental Status Per Nursing Assessment::   On Admission:  Suicidal ideation indicated by patient  Demographic Factors:  Male, Caucasian, Low socioeconomic status and Unemployed  Loss Factors: NA  Historical Factors: Impulsivity  Risk Reduction Factors:   Positive coping skills or problem solving skills  Continued Clinical Symptoms:  Depression:   Comorbid alcohol abuse/dependence Impulsivity Alcohol/Substance Abuse/Dependencies  Cognitive Features That Contribute To Risk:  None    Suicide Risk:  Minimal: No  identifiable suicidal ideation.  Patients presenting with no risk factors but with morbid ruminations; may be classified as minimal risk based on the severity of the depressive symptoms  Follow-up Information    Services, [facility] Follow up on 01/11/2019.   Why:  Please attend your screening for admission on Monday, 6/8 at 7:45a.  Be sure to bring your photo ID, proof of residency, 30-day supply of medication and 2 weeks worth of clothing.  Contact information: [address] [place] [address] [phone]           Plan Of Care/Follow-up recommendations:  Activity:  ad lib  Sharma Covert, MD 01/10/2019, 2:04 PM

## 2019-01-10 NOTE — [facility] Group Notes (Signed)
[facility] Notes: (Clinical Social Work)   01/10/2019      Type of Therapy:  Group Therapy   Participation Level:  Did Not Attend - was invited both individually by [facility] and by overhead announcement, chose not to attend.   Selmer Dominion, LCSW 01/10/2019, 12:43 PM

## 2019-01-11 NOTE — Progress Notes (Signed)
Pt was discharged to lobby to wait on a cab with a voucher to take him to [facility] where he will get further treatment   Pt received all belongings and follow up information   Pt received a supply of medications and prescriptions   Education completed and patient verbalized understanding   Pt denies suicidal and homicidal ideation   Pt expressed thanks for all that was done for him

## 2019-01-13 ENCOUNTER — Encounter (HOSPITAL_BASED_OUTPATIENT_CLINIC_OR_DEPARTMENT_OTHER): Payer: Self-pay | Admitting: *Deleted

## 2019-01-13 ENCOUNTER — Emergency Department (HOSPITAL_BASED_OUTPATIENT_CLINIC_OR_DEPARTMENT_OTHER)
Admission: EM | Admit: 2019-01-13 | Discharge: 2019-01-13 | Disposition: A | Discharge status: Home / Self Care | Payer: Self-pay | Attending: Emergency Medicine | Admitting: Emergency Medicine

## 2019-01-13 ENCOUNTER — Emergency Department (HOSPITAL_BASED_OUTPATIENT_CLINIC_OR_DEPARTMENT_OTHER): Payer: Self-pay

## 2019-01-13 ENCOUNTER — Emergency Department (HOSPITAL_COMMUNITY): Payer: Self-pay

## 2019-01-13 ENCOUNTER — Other Ambulatory Visit: Payer: Self-pay

## 2019-01-13 DIAGNOSIS — R42 Dizziness and giddiness: Secondary | ICD-10-CM | POA: Insufficient documentation

## 2019-01-13 DIAGNOSIS — Z8673 Personal history of transient ischemic attack (TIA), and cerebral infarction without residual deficits: Secondary | ICD-10-CM | POA: Insufficient documentation

## 2019-01-13 DIAGNOSIS — Z79899 Other long term (current) drug therapy: Secondary | ICD-10-CM | POA: Insufficient documentation

## 2019-01-13 DIAGNOSIS — J449 Chronic obstructive pulmonary disease, unspecified: Secondary | ICD-10-CM | POA: Insufficient documentation

## 2019-01-13 DIAGNOSIS — F17210 Nicotine dependence, cigarettes, uncomplicated: Secondary | ICD-10-CM | POA: Insufficient documentation

## 2019-01-13 LAB — CBC WITH DIFFERENTIAL/PLATELET
Abs Immature Granulocytes: 0.02 10*3/uL (ref 0.00–0.07)
Basophils Absolute: 0 10*3/uL (ref 0.0–0.1)
Basophils Relative: 1 %
Eosinophils Absolute: 0.2 10*3/uL (ref 0.0–0.5)
Eosinophils Relative: 3 %
HCT: 44.8 % (ref 39.0–52.0)
Hemoglobin: 14.8 g/dL (ref 13.0–17.0)
Immature Granulocytes: 0 %
Lymphocytes Relative: 24 %
Lymphs Abs: 1.3 10*3/uL (ref 0.7–4.0)
MCH: 32.5 pg (ref 26.0–34.0)
MCHC: 33 g/dL (ref 30.0–36.0)
MCV: 98.5 fL (ref 80.0–100.0)
Monocytes Absolute: 0.7 10*3/uL (ref 0.1–1.0)
Monocytes Relative: 13 %
Neutro Abs: 3.2 10*3/uL (ref 1.7–7.7)
Neutrophils Relative %: 59 %
Platelets: 157 10*3/uL (ref 150–400)
RBC: 4.55 MIL/uL (ref 4.22–5.81)
RDW: 12.5 % (ref 11.5–15.5)
WBC: 5.4 10*3/uL (ref 4.0–10.5)
nRBC: 0 % (ref 0.0–0.2)

## 2019-01-13 LAB — BASIC METABOLIC PANEL
Anion gap: 7 (ref 5–15)
BUN: 15 mg/dL (ref 6–20)
CO2: 27 mmol/L (ref 22–32)
Calcium: 9.1 mg/dL (ref 8.9–10.3)
Chloride: 104 mmol/L (ref 98–111)
Creatinine, Ser: 0.82 mg/dL (ref 0.61–1.24)
GFR calc Af Amer: >60 mL/min (ref >60)
GFR calc non Af Amer: >60 mL/min (ref >60)
Glucose, Bld: 105 mg/dL — ABNORMAL HIGH (ref 70–99)
Potassium: 4 mmol/L (ref 3.5–5.1)
Sodium: 138 mmol/L (ref 135–145)

## 2019-01-13 MED ORDER — MECLIZINE HCL 12.5 MG PO TABS: 12.5000 mg | ORAL_TABLET | Freq: Three times a day (TID) | ORAL | 0 refills | Status: AC | PRN

## 2019-01-13 MED ORDER — MECLIZINE HCL 25 MG PO TABS
25.0000 mg | ORAL_TABLET | Freq: Once | ORAL | Status: AC
  Administered 2019-01-13: 25 mg via ORAL
  Filled 2019-01-13: qty 1

## 2019-01-13 MED ORDER — IOHEXOL 350 MG/ML SOLN
100.0000 mL | Freq: Once | INTRAVENOUS | Status: AC | PRN
  Administered 2019-01-13: 100 mL via INTRAVENOUS

## 2019-01-13 MED ORDER — SODIUM CHLORIDE 0.9 % IV BOLUS
1000.0000 mL | Freq: Once | INTRAVENOUS | Status: AC
  Administered 2019-01-13: 1000 mL via INTRAVENOUS

## 2019-01-13 NOTE — ED Provider Notes (Signed)
58 year old male transferred from [place] with dizziness and gait abnormality.  He had evaluation there and was sent to [facility] for MRI.  Patient is currently on [facility] for alcohol rehab.  He no symptoms started after taking Risperdal.  On my assessment patient notes that symptoms have improved, no weaknesses or acute neurological deficits.  He notes he has some minor dizziness with ambulation.  Patient is still pending MRI here.  Care transferred to oncoming provider pending MRI and disposition.  Vitals:   01/13/19 1445 01/13/19 1500  BP: (!) 143/91 (!) 136/97  Pulse: 67 69  Resp:  18  Temp:    SpO2: 95% 96%     Okey Regal, PA-C 01/13/19 Gaylord Shih, MD 01/13/19 [phone]

## 2019-01-13 NOTE — ED Notes (Signed)
ED Provider at bedside. 

## 2019-01-13 NOTE — Discharge Instructions (Addendum)
You were seen in the emergency department today for dizziness.  Your blood work was all fairly normal.  Your MRI did not show signs of a stroke.  Your CT scan showed what is called atherosclerosis, otherwise noticed some plaque buildup in the arteries to your head and neck, please discuss with your primary care provider.  We are sending you home with meclizine to take as needed for dizziness.  Please take this as prescribed. We have prescribed you new medication(s) today. Discuss the medications prescribed today with your pharmacist as they can have adverse effects and interactions with your other medicines including over the counter and prescribed medications. Seek medical evaluation if you start to experience new or abnormal symptoms after taking one of these medicines, seek care immediately if you start to experience difficulty breathing, feeling of your throat closing, facial swelling, or rash as these could be indications of a more serious allergic reaction  Please follow-up with your primary care provider within 3 days.  Return to the ER for new or worsening symptoms including but not limited to worsening dizziness, persistent dizziness, inability to walk, falls, headache, numbness, weakness, passing out, chest pain, shortness of breath, fever, or any other concerns.

## 2019-01-13 NOTE — ED Notes (Signed)
Report given to Charm Rings Nurse at [facility] ED.

## 2019-01-13 NOTE — ED Notes (Signed)
Pt has been given sandwich and crackers

## 2019-01-13 NOTE — ED Notes (Signed)
Pt states he is dizzy and complains of headache 6/10.  Pt able to ambulate to bed with minimal assistance.

## 2019-01-13 NOTE — ED Provider Notes (Signed)
[facility] EMERGENCY DEPARTMENT Provider Note   CSN: [id number] Arrival date & time: 01/13/19  1044    History   Chief Complaint Chief Complaint  Patient presents with   Dizziness    HPI Billy PalingRichard Rosner is a 58 y.o. male.     The history is provided by the patient.  Dizziness  Quality:  Imbalance Severity:  Moderate Onset quality:  Gradual Timing:  Constant Progression:  Worsening Chronicity:  New Context: standing up   Relieved by:  Nothing Worsened by:  Movement Associated symptoms: tinnitus (chronic)   Associated symptoms: no blood in stool, no chest pain, no headaches, no nausea, no palpitations, no shortness of breath, no vomiting and no weakness   Risk factors: hx of stroke   Risk factors: no hx of vertigo     Past Medical History:  Diagnosis Date   COPD (chronic obstructive pulmonary disease) (HCC)    Hepatitis C    Seizures (HCC)    Stroke (HCC) 2013    Patient Active Problem List   Diagnosis Date Noted   Severe recurrent major depression without psychotic features (HCC) 01/06/2019   Substance-induced psychotic disorder (HCC) 12/19/2018   Psychoactive substance-induced psychosis (HCC) 12/19/2018    History reviewed. No pertinent surgical history.      Home Medications    Prior to Admission medications   Medication Sig Start Date End Date Taking? Authorizing Provider  benztropine (COGENTIN) 0.5 MG tablet Take 1 tablet (0.5 mg total) by mouth daily. 01/10/19  Yes Oneta RackLewis, Tanika N, NP  FLUoxetine (PROZAC) 20 MG capsule Take 1 capsule (20 mg total) by mouth daily. 01/11/19  Yes Oneta RackLewis, Tanika N, NP  gabapentin (NEURONTIN) 300 MG capsule Take 1 capsule (300 mg total) by mouth 3 (three) times daily. 01/10/19  Yes Oneta RackLewis, Tanika N, NP  hydrOXYzine (ATARAX/VISTARIL) 25 MG tablet Take 1 tablet (25 mg total) by mouth 3 (three) times daily as needed for anxiety. 01/10/19  Yes Oneta RackLewis, Tanika N, NP  levETIRAcetam (KEPPRA) 500 MG tablet Take 1 tablet  (500 mg total) by mouth 2 (two) times daily. 01/10/19  Yes Oneta RackLewis, Tanika N, NP  risperiDONE (RISPERDAL) 0.5 MG tablet Take 1 tablet (0.5 mg total) by mouth 2 (two) times daily. 01/10/19  Yes Oneta RackLewis, Tanika N, NP  traZODone (DESYREL) 50 MG tablet Take 1 tablet (50 mg total) by mouth at bedtime as needed for sleep. 01/10/19  Yes Oneta RackLewis, Tanika N, NP  gabapentin (NEURONTIN) 300 MG capsule Take 1 capsule (300 mg total) by mouth 3 (three) times daily. 01/10/19 01/10/20  Laveda AbbeParks, Laurie Britton, NP    Family History Family History  Problem Relation Age of Onset   Stroke Father     Social History Social History   Tobacco Use   Smoking status: Current Every Day Smoker    Packs/day: 1.00    Years: 25.00    Pack years: 25.00    Types: Cigarettes   Smokeless tobacco: Never Used   Tobacco comment: quit a week ago  Substance Use Topics   Alcohol use: Not Currently    Alcohol/week: 8.0 standard drinks    Types: 8 Cans of beer per week    Comment: per week, quit 8 days ago   Drug use: Not Currently    Frequency: 1.0 times per week    Types: IV, Methamphetamines, Cocaine, Marijuana    Comment: per month, quit for 8 days     Allergies   Patient has no known allergies.   Review of  Systems Review of Systems  Constitutional: Negative for chills and fever.  HENT: Positive for tinnitus (chronic). Negative for ear pain and sore throat.   Eyes: Negative for photophobia, pain and visual disturbance.  Respiratory: Negative for cough and shortness of breath.   Cardiovascular: Negative for chest pain and palpitations.  Gastrointestinal: Negative for abdominal pain, blood in stool, nausea and vomiting.  Genitourinary: Negative for dysuria and hematuria.  Musculoskeletal: Positive for gait problem. Negative for arthralgias and back pain.  Skin: Negative for color change and rash.  Neurological: Positive for dizziness. Negative for tremors, seizures, syncope, facial asymmetry, speech difficulty, weakness,  light-headedness, numbness and headaches.  All other systems reviewed and are negative.    Physical Exam Updated Vital Signs  ED Triage Vitals  Enc Vitals Group     BP 01/13/19 1056 (!) 119/93     Pulse Rate 01/13/19 1056 75     Resp 01/13/19 1056 16     Temp 01/13/19 1056 (!) 97.5 F (36.4 C)     Temp Source 01/13/19 1056 Oral     SpO2 01/13/19 1056 97 %     Weight 01/13/19 1058 210 lb (95.3 kg)     Height 01/13/19 1058 6' (1.829 m)     Head Circumference --      Peak Flow --      Pain Score 01/13/19 1057 6     Pain Loc --      Pain Edu? --      Excl. in GC? --     Physical Exam Vitals signs and nursing note reviewed.  Constitutional:      General: He is not in acute distress.    Appearance: He is well-developed. He is ill-appearing.  HENT:     Head: Normocephalic and atraumatic.     Mouth/Throat:     Mouth: Mucous membranes are moist.  Eyes:     Extraocular Movements: Extraocular movements intact.     Conjunctiva/sclera: Conjunctivae normal.     Pupils: Pupils are equal, round, and reactive to light.     Comments: 20/20 vision b/l, no visual field cut  Neck:     Musculoskeletal: Neck supple.  Cardiovascular:     Rate and Rhythm: Normal rate and regular rhythm.     Heart sounds: No murmur.  Pulmonary:     Effort: Pulmonary effort is normal. No respiratory distress.     Breath sounds: Normal breath sounds.  Abdominal:     Palpations: Abdomen is soft.     Tenderness: There is no abdominal tenderness.  Skin:    General: Skin is warm and dry.  Neurological:     General: No focal deficit present.     Mental Status: He is alert and oriented to person, place, and time.     Cranial Nerves: No cranial nerve deficit.     Sensory: No sensory deficit.     Motor: No weakness.     Coordination: Coordination abnormal (finger to nose finger slightly slowed).     Gait: Gait abnormal (wide based unsteady gait and patient unable to not fall over).     Comments: Normal  speech, 5+ out of 5 strength throughout, normal sensation      ED Treatments / Results  Labs (all labs ordered are listed, but only abnormal results are displayed) Labs Reviewed  BASIC METABOLIC PANEL - Abnormal; Notable for the following components:      Result Value   Glucose, Bld 105 (*)    All  other components within normal limits  CBC WITH DIFFERENTIAL/PLATELET    EKG None  Radiology Ct Angio Head W Or Wo Contrast  Result Date: 01/13/2019 CLINICAL DATA:  Severe persistent vertigo. Gait disturbance. History of stroke. EXAM: CT ANGIOGRAPHY HEAD AND NECK TECHNIQUE: Multidetector CT imaging of the head and neck was performed using the standard protocol during bolus administration of intravenous contrast. Multiplanar CT image reconstructions and MIPs were obtained to evaluate the vascular anatomy. Carotid stenosis measurements (when applicable) are obtained utilizing NASCET criteria, using the distal internal carotid diameter as the denominator. CONTRAST:  100mL OMNIPAQUE IOHEXOL 350 MG/ML SOLN COMPARISON:  None. FINDINGS: CT HEAD FINDINGS Brain: Normal appearance by CT. No evidence of atrophy, old or acute small or large vessel infarction, mass lesion, hemorrhage, hydrocephalus or extra-axial collection. Vascular: There is atherosclerotic calcification of the major vessels at the base of the brain. Skull: Normal Sinuses: Clear Orbits: Normal Review of the MIP images confirms the above findings CTA NECK FINDINGS Aortic arch: Aortic atherosclerosis. No aneurysm or dissection. No brachiocephalic vessel origin stenosis. Right carotid system: Common carotid artery widely patent to the bifurcation. Soft and calcified plaque at the carotid bifurcation and ICA bulb but no stenosis. Cervical ICA is tortuous but widely patent. Left carotid system: Common carotid artery widely patent to the bifurcation. Calcified plaque at the carotid bifurcation and ICA bulb. No stenosis. Cervical ICA is tortuous but  widely patent. Vertebral arteries: Both vertebral artery origins are widely patent. Both vertebral arteries appear normal an widely patent through the cervical region to the foramen magnum. Skeleton: Ordinary cervical spondylosis. Other neck: No soft tissue mass or lymphadenopathy. Upper chest: No active process. Review of the MIP images confirms the above findings CTA HEAD FINDINGS Anterior circulation: Both internal carotid arteries are patent through the skull base. There is atherosclerotic calcification in the carotid siphon regions and supraclinoid internal carotid arteries. Narrowing of the supraclinoid internal carotid arteries is estimated at 30-50%. The anterior and middle cerebral vessels are patent without proximal stenosis, aneurysm or vascular malformation. No large or medium vessel occlusion is seen. Posterior circulation: Both vertebral arteries are widely patent through the foramen magnum to the basilar. No basilar stenosis. Posterior circulation branch vessels appear normal and patent. Venous sinuses: Patent and normal. Anatomic variants: None significant. Delayed phase: Not performed. Review of the MIP images confirms the above findings IMPRESSION: Head CT: Normal for age. CT angiography of the neck vessels: Atherosclerotic disease at both carotid bifurcations but no stenosis. Intracranial CT angiography: Atherosclerotic change in the carotid siphon and supraclinoid ICAs. 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 No anterior circulation abnormality seen distal to that. No posterior circulation abnormality. Electronically Signed   By: Paulina FusiMark  Shogry M.D.   On: 01/13/2019 11:54   Ct Angio Neck W And/or Wo Contrast  Result Date: 01/13/2019 CLINICAL DATA:  Severe persistent vertigo. Gait disturbance. History of stroke. EXAM: CT ANGIOGRAPHY HEAD AND NECK TECHNIQUE: Multidetector CT imaging of the head and neck was performed using the standard protocol during bolus  administration of intravenous contrast. Multiplanar CT image reconstructions and MIPs were obtained to evaluate the vascular anatomy. Carotid stenosis measurements (when applicable) are obtained utilizing NASCET criteria, using the distal internal carotid diameter as the denominator. CONTRAST:  100mL OMNIPAQUE IOHEXOL 350 MG/ML SOLN COMPARISON:  None. FINDINGS: CT HEAD FINDINGS Brain: Normal appearance by CT. No evidence of atrophy, old or acute small or large vessel infarction, mass lesion, hemorrhage, hydrocephalus or extra-axial collection. Vascular: There is  atherosclerotic calcification of the major vessels at the base of the brain. Skull: Normal Sinuses: Clear Orbits: Normal Review of the MIP images confirms the above findings CTA NECK FINDINGS Aortic arch: Aortic atherosclerosis. No aneurysm or dissection. No brachiocephalic vessel origin stenosis. Right carotid system: Common carotid artery widely patent to the bifurcation. Soft and calcified plaque at the carotid bifurcation and ICA bulb but no stenosis. Cervical ICA is tortuous but widely patent. Left carotid system: Common carotid artery widely patent to the bifurcation. Calcified plaque at the carotid bifurcation and ICA bulb. No stenosis. Cervical ICA is tortuous but widely patent. Vertebral arteries: Both vertebral artery origins are widely patent. Both vertebral arteries appear normal an widely patent through the cervical region to the foramen magnum. Skeleton: Ordinary cervical spondylosis. Other neck: No soft tissue mass or lymphadenopathy. Upper chest: No active process. Review of the MIP images confirms the above findings CTA HEAD FINDINGS Anterior circulation: Both internal carotid arteries are patent through the skull base. There is atherosclerotic calcification in the carotid siphon regions and supraclinoid internal carotid arteries. Narrowing of the supraclinoid internal carotid arteries is estimated at 30-50%. The anterior and middle cerebral  vessels are patent without proximal stenosis, aneurysm or vascular malformation. No large or medium vessel occlusion is seen. Posterior circulation: Both vertebral arteries are widely patent through the foramen magnum to the basilar. No basilar stenosis. Posterior circulation branch vessels appear normal and patent. Venous sinuses: Patent and normal. Anatomic variants: None significant. Delayed phase: Not performed. Review of the MIP images confirms the above findings IMPRESSION: Head CT: Normal for age. CT angiography of the neck vessels: Atherosclerotic disease at both carotid bifurcations but no stenosis. Intracranial CT angiography: Atherosclerotic change in the carotid siphon and supraclinoid ICAs. 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 No anterior circulation abnormality seen distal to that. No posterior circulation abnormality. Electronically Signed   By: Nelson Chimes M.D.   On: 01/13/2019 11:54    Procedures Procedures (including critical care time)  Medications Ordered in ED Medications  meclizine (ANTIVERT) tablet 25 mg (25 mg Oral Given 01/13/19 1143)  sodium chloride 0.9 % bolus 1,000 mL (1,000 mLs Intravenous New Bag/Given 01/13/19 1126)  iohexol (OMNIPAQUE) 350 MG/ML injection 100 mL (100 mLs Intravenous Contrast Given 01/13/19 1130)     Initial Impression / Assessment and Plan / ED Course  I have reviewed the triage vital signs and the nursing notes.  Pertinent labs & imaging results that were available during my care of the patient were reviewed by me and considered in my medical decision making (see chart for details).       Kahner Yanik is a 58 year old male with history of stroke, seizures, COPD who presents to the ED with dizziness, gait abnormality.  Patient with unremarkable vitals.  No fever.  Patient states that he has had symptoms over the last several days and has gotten more persistent and worse during this time.  [facility]  Patient states that symptoms started after taking Risperdal.  Patient did not have any major withdrawals of alcohol he states.  He has persistent dizziness in bed ambulation he states.  Patient overall has normal neurological exam.  No obvious visual field deficits.  However, patient has unsteady gait that is wide-based and easily falls over. Poor finger to nose finger. States that he is constantly dizzy.  Does have chronic ringing of his ears.  Possibly peripheral vertigo versus medication side effect  versus stroke.  Patient had a CTA of his head and neck that were overall unremarkable.  Lab work overall unremarkable.  No significant anemia, electrolyte abnormality, kidney injury.  Patient was given meclizine but continues to have severe symptoms and has concerning gait.  He also had slowed finger-to-nose finger.  Concern for stroke.  D[facility] with [facility]it[facility]ett [facility] for ED to ED transfer for MRI of his brain to further evaluate for stroke.    Recommend that patient get MRI and if still symptomatic would benefit from a neurology consultation.  This could be secondary to alcohol and vitamin deficiencies as well.  Patient hemodynamically stable throughout my care and was transferred to [facility] for MRI to rule out stroke and possible neurology consultation.  This chart was dictated using voice recognition software.  Despite best efforts to proofread,  errors can occur which can change the documentation meaning.    Final Clinical Impressions(s) / ED Diagnoses   Final diagnoses:  Dizziness    ED Discharge Orders    None       Virgina NorfolkCuratolo, Maddisen Vought, DO 01/13/19 1247

## 2019-01-13 NOTE — ED Notes (Signed)
RN contacted case manager about providing pt with a transportation to [facility]. [facility] to pick pt up at ED entrance. Pt informed to stand by near drop off area.

## 2019-01-13 NOTE — ED Notes (Signed)
[facility] [phone] was called to informed them that patient is being transferred to [facility] ER for MRI.  I spoke with Patrici Ranks.

## 2019-01-13 NOTE — ED Triage Notes (Signed)
Patient is from [facility].  Came in today for dizziness, unsteady gait and stated that he needs medication adjustment that causes him to feel dizzy.

## 2019-01-13 NOTE — ED Notes (Signed)
Patient transported to MRI 

## 2019-01-13 NOTE — ED Provider Notes (Signed)
19:00: Assumed care of patient @ change of shift from Burna FortsJeff Hedges PA-C pending MRI & disposition.   Please see prior provider note for full H&P.  Briefly patient is a 58 year old male that was transferred from [facility] for dizziness with gait abnormality.  His symptoms began after starting to take Risperdal, he is currently at [facility] for alcohol rehab.  Upon arrival to [facility] he had improvement in his symptoms.  Received fluids and meclizine prior to arrival.  Awaiting MRI.  Physical Exam  BP (!) 136/97   Pulse 69   Temp 98.1 F (36.7 C) (Oral)   Resp 18   Ht 6' (1.829 m)   Wt 95.3 kg   SpO2 96%   BMI 28.48 kg/m   Physical Exam Vitals signs and nursing note reviewed.  Constitutional:      General: He is not in acute distress.    Appearance: He is well-developed.  HENT:     Head: Normocephalic and atraumatic.  Eyes:     General:        Right eye: No discharge.        Left eye: No discharge.     Conjunctiva/sclera: Conjunctivae normal.  Neurological:     Mental Status: He is alert.     Comments: Clear speech.  CN III through XII grossly intact.  Sensation grossly intact bilateral upper and lower extremities.  5 out of 5 symmetric grip strength.  5 out of 5 strength with plantar dorsiflexion bilaterally.  Patient slightly slow with finger-to-nose but is able to perform this without significant discoordination.  Negative pronator drift.  He is able to ambulate with a fairly steady gait on my assessment.  Psychiatric:        Behavior: Behavior normal.        Thought Content: Thought content normal.    ED Course/Procedures     Results for orders placed or performed during the hospital encounter of 01/13/19  CBC with Differential  Result Value Ref Range   WBC 5.4 4.0 - 10.5 K/uL   RBC 4.55 4.22 - 5.81 MIL/uL   Hemoglobin 14.8 13.0 - 17.0 g/dL   HCT 16.144.8 09.639.0 - 04.552.0 %   MCV 98.5 80.0 - 100.0 fL   MCH 32.5 26.0 - 34.0 pg   MCHC 33.0 30.0 - 36.0 g/dL   RDW  40.912.5 81.111.5 - 91.415.5 %   Platelets 157 150 - 400 K/uL   nRBC 0.0 0.0 - 0.2 %   Neutrophils Relative % 59 %   Neutro Abs 3.2 1.7 - 7.7 K/uL   Lymphocytes Relative 24 %   Lymphs Abs 1.3 0.7 - 4.0 K/uL   Monocytes Relative 13 %   Monocytes Absolute 0.7 0.1 - 1.0 K/uL   Eosinophils Relative 3 %   Eosinophils Absolute 0.2 0.0 - 0.5 K/uL   Basophils Relative 1 %   Basophils Absolute 0.0 0.0 - 0.1 K/uL   Immature Granulocytes 0 %   Abs Immature Granulocytes 0.02 0.00 - 0.07 K/uL  Basic metabolic panel  Result Value Ref Range   Sodium 138 135 - 145 mmol/L   Potassium 4.0 3.5 - 5.1 mmol/L   Chloride 104 98 - 111 mmol/L   CO2 27 22 - 32 mmol/L   Glucose, Bld 105 (H) 70 - 99 mg/dL   BUN 15 6 - 20 mg/dL   Creatinine, Ser 7.820.82 0.61 - 1.24 mg/dL   Calcium 9.1 8.9 - 95.610.3 mg/dL   GFR calc non  Af Amer >60 >60 mL/min   GFR calc Af Amer >60 >60 mL/min   Anion gap 7 5 - 15   Ct Angio Head W Or Wo Contrast  Result Date: 01/13/2019 CLINICAL DATA:  Severe persistent vertigo. Gait disturbance. History of stroke. EXAM: CT ANGIOGRAPHY HEAD AND NECK TECHNIQUE: Multidetector CT imaging of the head and neck was performed using the standard protocol during bolus administration of intravenous contrast. Multiplanar CT image reconstructions and MIPs were obtained to evaluate the vascular anatomy. Carotid stenosis measurements (when applicable) are obtained utilizing NASCET criteria, using the distal internal carotid diameter as the denominator. CONTRAST:  100mL OMNIPAQUE IOHEXOL 350 MG/ML SOLN COMPARISON:  None. FINDINGS: CT HEAD FINDINGS Brain: Normal appearance by CT. No evidence of atrophy, old or acute small or large vessel infarction, mass lesion, hemorrhage, hydrocephalus or extra-axial collection. Vascular: There is atherosclerotic calcification of the major vessels at the base of the brain. Skull: Normal Sinuses: Clear Orbits: Normal Review of the MIP images confirms the above findings CTA NECK FINDINGS Aortic  arch: Aortic atherosclerosis. No aneurysm or dissection. No brachiocephalic vessel origin stenosis. Right carotid system: Common carotid artery widely patent to the bifurcation. Soft and calcified plaque at the carotid bifurcation and ICA bulb but no stenosis. Cervical ICA is tortuous but widely patent. Left carotid system: Common carotid artery widely patent to the bifurcation. Calcified plaque at the carotid bifurcation and ICA bulb. No stenosis. Cervical ICA is tortuous but widely patent. Vertebral arteries: Both vertebral artery origins are widely patent. Both vertebral arteries appear normal an widely patent through the cervical region to the foramen magnum. Skeleton: Ordinary cervical spondylosis. Other neck: No soft tissue mass or lymphadenopathy. Upper chest: No active process. Review of the MIP images confirms the above findings CTA HEAD FINDINGS Anterior circulation: Both internal carotid arteries are patent through the skull base. There is atherosclerotic calcification in the carotid siphon regions and supraclinoid internal carotid arteries. Narrowing of the supraclinoid internal carotid arteries is estimated at 30-50%. The anterior and middle cerebral vessels are patent without proximal stenosis, aneurysm or vascular malformation. No large or medium vessel occlusion is seen. Posterior circulation: Both vertebral arteries are widely patent through the foramen magnum to the basilar. No basilar stenosis. Posterior circulation branch vessels appear normal and patent. Venous sinuses: Patent and normal. Anatomic variants: None significant. Delayed phase: Not performed. Review of the MIP images confirms the above findings IMPRESSION: Head CT: Normal for age. CT angiography of the neck vessels: Atherosclerotic disease at both carotid bifurcations but no stenosis. Intracranial CT angiography: Atherosclerotic change in the carotid siphon and supraclinoid ICAs. 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 No anterior circulation abnormality seen distal to that. No posterior circulation abnormality. Electronically Signed   By: Paulina FusiMark  Shogry M.D.   On: 01/13/2019 11:54   Ct Angio Neck W And/or Wo Contrast  Result Date: 01/13/2019 CLINICAL DATA:  Severe persistent vertigo. Gait disturbance. History of stroke. EXAM: CT ANGIOGRAPHY HEAD AND NECK TECHNIQUE: Multidetector CT imaging of the head and neck was performed using the standard protocol during bolus administration of intravenous contrast. Multiplanar CT image reconstructions and MIPs were obtained to evaluate the vascular anatomy. Carotid stenosis measurements (when applicable) are obtained utilizing NASCET criteria, using the distal internal carotid diameter as the denominator. CONTRAST:  100mL OMNIPAQUE IOHEXOL 350 MG/ML SOLN COMPARISON:  None. FINDINGS: CT HEAD FINDINGS Brain: Normal appearance by CT. No evidence of atrophy, old or acute small or large vessel infarction, mass lesion,  hemorrhage, hydrocephalus or extra-axial collection. Vascular: There is atherosclerotic calcification of the major vessels at the base of the brain. Skull: Normal Sinuses: Clear Orbits: Normal Review of the MIP images confirms the above findings CTA NECK FINDINGS Aortic arch: Aortic atherosclerosis. No aneurysm or dissection. No brachiocephalic vessel origin stenosis. Right carotid system: Common carotid artery widely patent to the bifurcation. Soft and calcified plaque at the carotid bifurcation and ICA bulb but no stenosis. Cervical ICA is tortuous but widely patent. Left carotid system: Common carotid artery widely patent to the bifurcation. Calcified plaque at the carotid bifurcation and ICA bulb. No stenosis. Cervical ICA is tortuous but widely patent. Vertebral arteries: Both vertebral artery origins are widely patent. Both vertebral arteries appear normal an widely patent through the cervical region to the foramen magnum. Skeleton: Ordinary cervical  spondylosis. Other neck: No soft tissue mass or lymphadenopathy. Upper chest: No active process. Review of the MIP images confirms the above findings CTA HEAD FINDINGS Anterior circulation: Both internal carotid arteries are patent through the skull base. There is atherosclerotic calcification in the carotid siphon regions and supraclinoid internal carotid arteries. Narrowing of the supraclinoid internal carotid arteries is estimated at 30-50%. The anterior and middle cerebral vessels are patent without proximal stenosis, aneurysm or vascular malformation. No large or medium vessel occlusion is seen. Posterior circulation: Both vertebral arteries are widely patent through the foramen magnum to the basilar. No basilar stenosis. Posterior circulation branch vessels appear normal and patent. Venous sinuses: Patent and normal. Anatomic variants: None significant. Delayed phase: Not performed. Review of the MIP images confirms the above findings IMPRESSION: Head CT: Normal for age. CT angiography of the neck vessels: Atherosclerotic disease at both carotid bifurcations but no stenosis. Intracranial CT angiography: Atherosclerotic change in the carotid siphon and supraclinoid ICAs. 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 No anterior circulation abnormality seen distal to that. No posterior circulation abnormality. Electronically Signed   By: Paulina FusiMark  Shogry M.D.   On: 01/13/2019 11:54   Mr Brain Wo Contrast (neuro Protocol)  Result Date: 01/13/2019 CLINICAL DATA:  Dizziness and ataxia EXAM: MRI HEAD WITHOUT CONTRAST TECHNIQUE: Multiplanar, multiecho pulse sequences of the brain and surrounding structures were obtained without intravenous contrast. COMPARISON:  None. FINDINGS: BRAIN: There is no acute infarct, acute hemorrhage or extra-axial collection. The midline structures are normal. No midline shift or other mass effect. The white matter signal is normal for the patient's age. The cerebral  and cerebellar volume are age-appropriate. No hydrocephalus. Susceptibility-sensitive sequences show no chronic microhemorrhage or superficial siderosis. Prominent perivascular space in the left parietal white matter. VASCULAR: The major intracranial arterial and venous sinus flow voids are normal. SKULL AND UPPER CERVICAL SPINE: Calvarial bone marrow signal is normal. There is no skull base mass. Visualized upper cervical spine and soft tissues are normal. SINUSES/ORBITS: No fluid levels or advanced mucosal thickening. No mastoid or middle ear effusion. The orbits are normal. IMPRESSION: Normal brain. Electronically Signed   By: Deatra RobinsonKevin  Herman M.D.   On: 01/13/2019 20:54   Procedures  MDM   MRI with normal brain.  No signs of stroke.  Patient feeling improved on reassessment he is able to ambulate with fairly steady gait.  Appears appropriate for discharge home with PCP follow-up at this time.  Will trial meclizine PRN as this seemed to have helped him symptoms since he has been here. I discussed results, treatment plan, need for follow-up, and return precautions with the patient. Provided opportunity for questions, patient  confirmed understanding and is in agreement with plan.   Findings and plan of care discussed with supervising physician Dr. Laverta Baltimore who is in agreement.   Blood pressure 129/87, pulse 69, temperature 98.1 F (36.7 C), temperature source Oral, resp. rate 18, height 6' (1.829 m), weight 95.3 kg, SpO2 97 %.    Leafy Kindle 01/13/19 [id number]    Margette Fast, MD 01/14/19 1151

## 2020-11-23 IMAGING — CT CT ANGIOGRAPHY CHEST
2 of 9 series · 18 of 46 positions shown · IV contrast (omnipaque)
Comparison: 12/12/2018

CLINICAL DATA: Short of breath.  Concern for pulmonary embolism

EXAM:
CT ANGIOGRAPHY CHEST WITH CONTRAST
TECHNIQUE: Multidetector CT imaging of the chest was performed using the
standard protocol during bolus administration of intravenous
contrast. Multiplanar CT image reconstructions and MIPs were
obtained to evaluate the vascular anatomy.
CONTRAST:  100mL OMNIPAQUE IOHEXOL 350 MG/ML SOLN

[Series 7: thins · axial · 0.91mm/px · z∈[+1060,+1366]mm · 15 of 338 slices shown]
[im 16/338  lung]
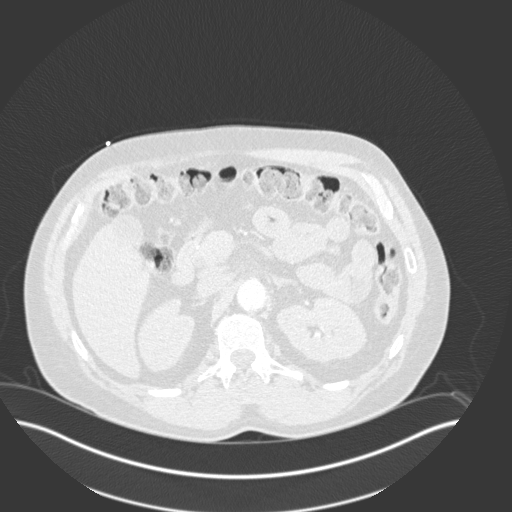
[im 46/338  soft-tissue]
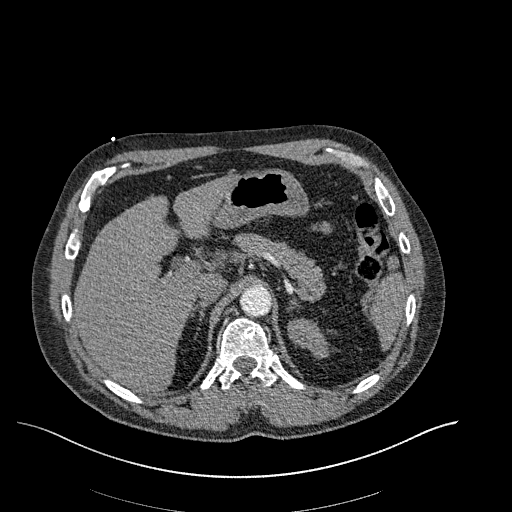
[im 62/338  lung]
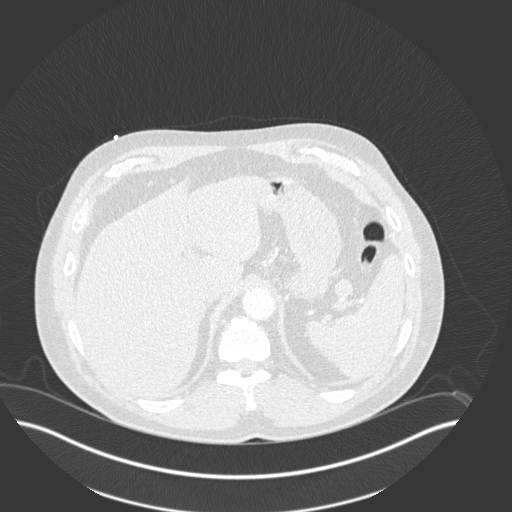
[im 77/338  soft-tissue]
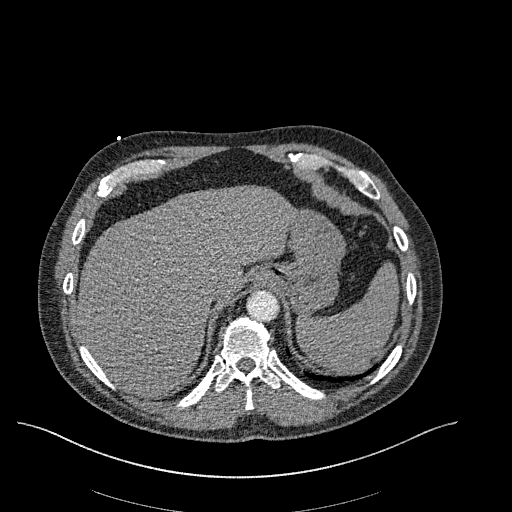
[im 108/338  lung]
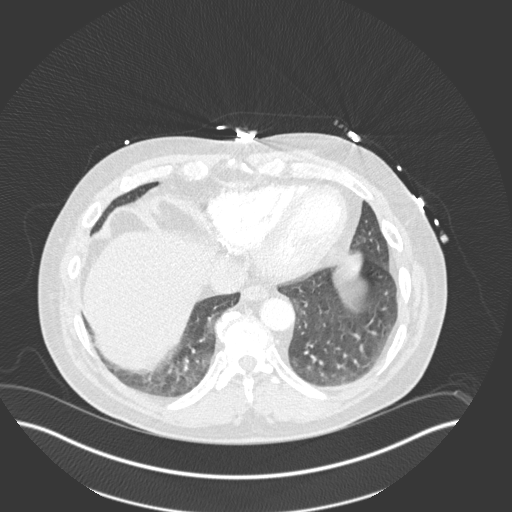
[im 123/338  soft-tissue]
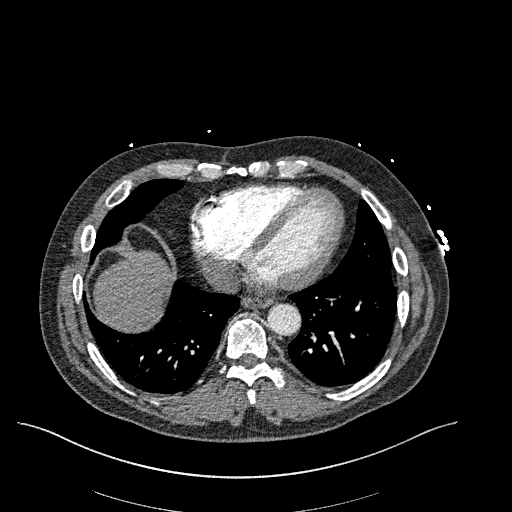
[im 154/338  lung]
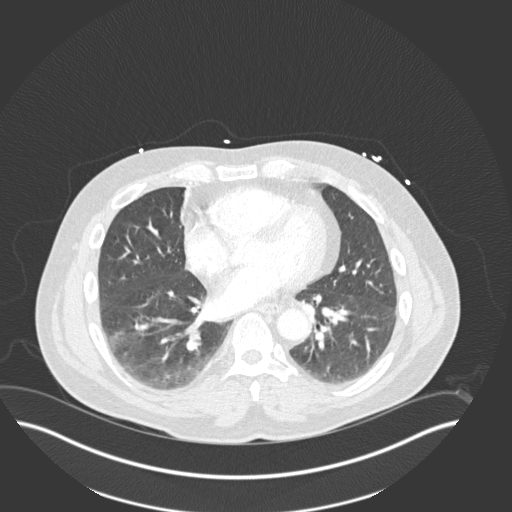
[im 169/338  soft-tissue]
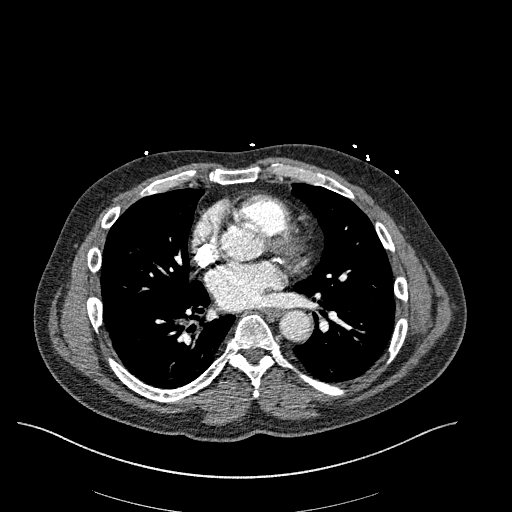
[im 184/338  lung]
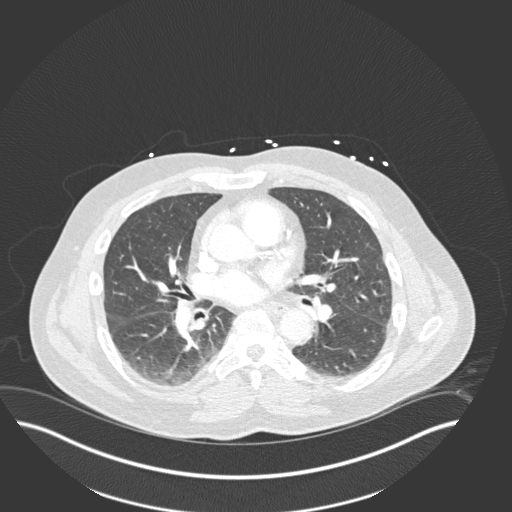
[im 215/338  soft-tissue]
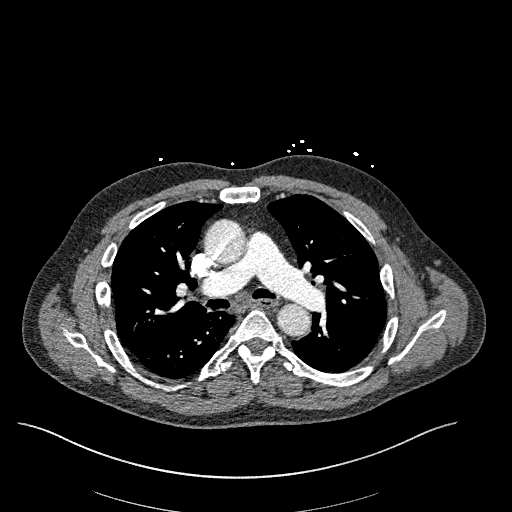
[im 230/338  lung]
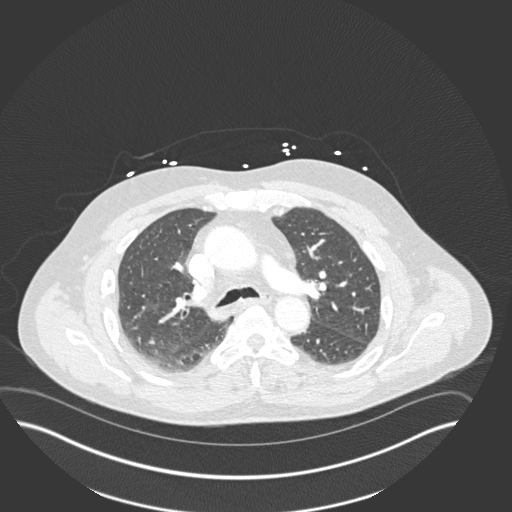
[im 261/338  soft-tissue]
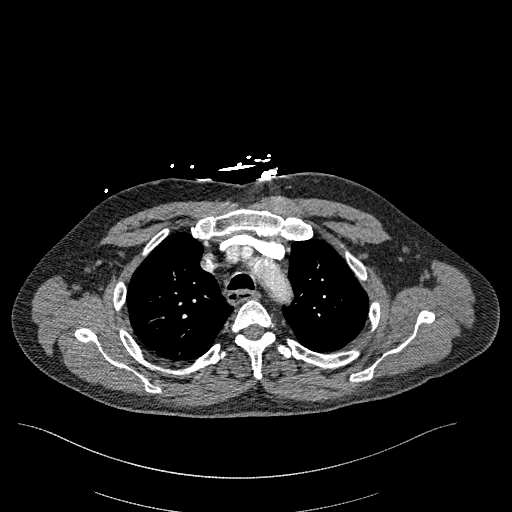
[im 276/338  lung]
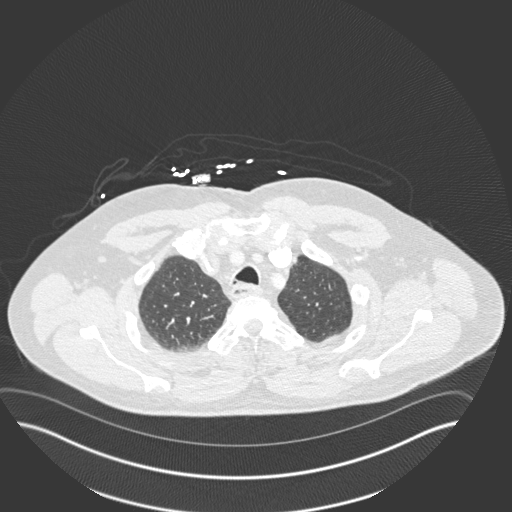
[im 292/338  soft-tissue]
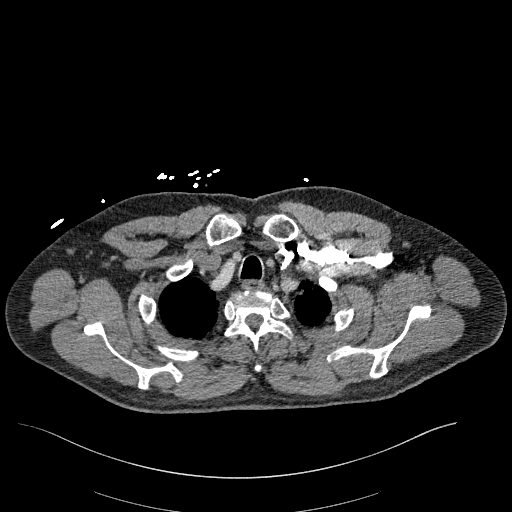
[im 322/338  lung]
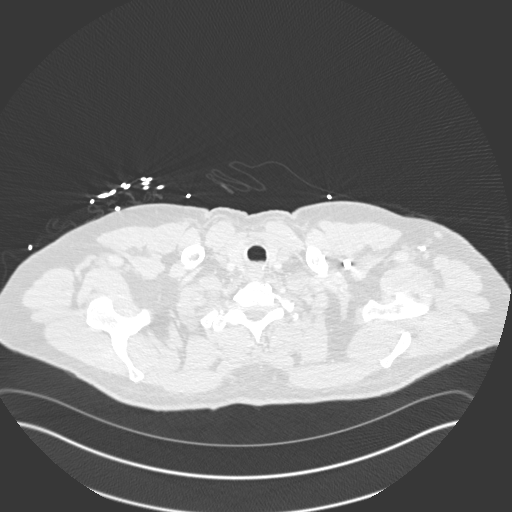

[Series 9: coronal mpr · coronal · 0.61mm/px · 3 of 174 slices shown]
[im 44/174  soft-tissue]
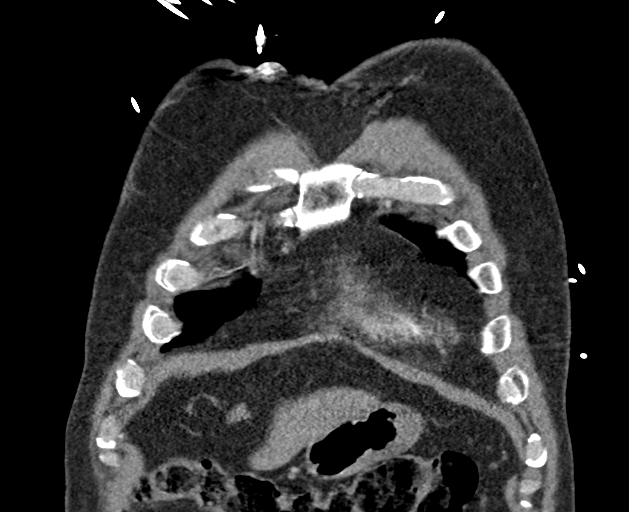
[im 87/174  soft-tissue]
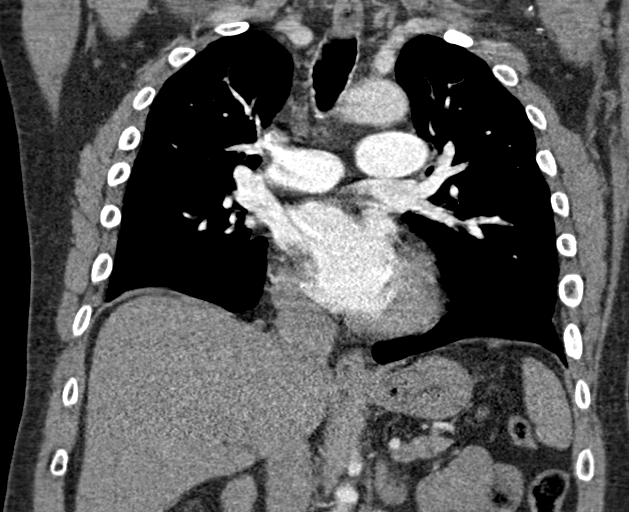
[im 130/174  soft-tissue]
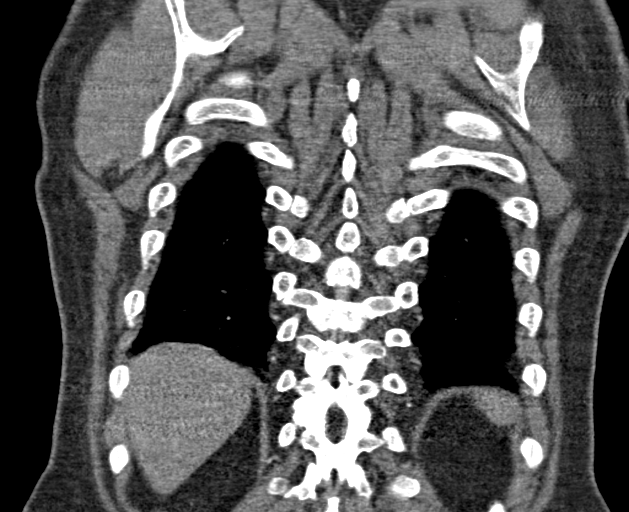

[18 of 46 positions shown; findings below may reference images not displayed]

FINDINGS: Cardiovascular: No filling defects within the pulmonary arteries to
suggest acute pulmonary embolism. No acute findings of the aorta or
great vessels. No pericardial fluid.

Mediastinum/Nodes: No axillary supraclavicular adenopathy. No
mediastinal adenopathy. No pericardial effusion. Esophagus normal.

Coronary artery calcification and aortic atherosclerotic
calcification.

Lungs/Pleura: Mild RIGHT basilar atelectasis. No pulmonary
infarction. No pneumonia. No pneumothorax

Upper Abdomen: Limited view of the liver, kidneys, pancreas are
unremarkable. Normal adrenal glands.

Musculoskeletal: No aggressive osseous lesion.

Review of the MIP images confirms the above findings.
IMPRESSION: 1. No acute pulmonary embolism.
2. Mild RIGHT basilar atelectasis.
3. Coronary artery calcification and Aortic Atherosclerosis
([id number]).
